# Patient Record
Sex: Female | Born: 1970 | Race: Black or African American | Hispanic: No | Marital: Married | Smoking: Never smoker
Health system: Southern US, Community
[De-identification: ages and names within clinical notes are randomized; demographics above are authoritative.]

## PROBLEM LIST (undated history)

## (undated) DIAGNOSIS — R519 Headache, unspecified: Secondary | ICD-10-CM

## (undated) DIAGNOSIS — L989 Disorder of the skin and subcutaneous tissue, unspecified: Secondary | ICD-10-CM

## (undated) DIAGNOSIS — B019 Varicella without complication: Secondary | ICD-10-CM

## (undated) DIAGNOSIS — D869 Sarcoidosis, unspecified: Secondary | ICD-10-CM

## (undated) DIAGNOSIS — I499 Cardiac arrhythmia, unspecified: Secondary | ICD-10-CM

## (undated) DIAGNOSIS — K219 Gastro-esophageal reflux disease without esophagitis: Secondary | ICD-10-CM

## (undated) DIAGNOSIS — D649 Anemia, unspecified: Secondary | ICD-10-CM

## (undated) DIAGNOSIS — R51 Headache: Secondary | ICD-10-CM

## (undated) HISTORY — DX: Gastro-esophageal reflux disease without esophagitis: K21.9

## (undated) HISTORY — DX: Varicella without complication: B01.9

## (undated) HISTORY — DX: Anemia, unspecified: D64.9

## (undated) HISTORY — PX: COLONOSCOPY: SHX174

## (undated) HISTORY — PX: TUBAL LIGATION: SHX77

---

## 1998-02-19 ENCOUNTER — Ambulatory Visit (HOSPITAL_COMMUNITY): Admission: RE | Admit: 1998-02-19 | Discharge: 1998-02-19 | Payer: Self-pay | Admitting: Obstetrics & Gynecology

## 1998-03-25 ENCOUNTER — Inpatient Hospital Stay (HOSPITAL_COMMUNITY): Admission: AD | Admit: 1998-03-25 | Discharge: 1998-03-25 | Payer: Self-pay | Admitting: Obstetrics & Gynecology

## 1998-06-17 ENCOUNTER — Inpatient Hospital Stay (HOSPITAL_COMMUNITY): Admission: AD | Admit: 1998-06-17 | Discharge: 1998-06-21 | Payer: Self-pay | Admitting: *Deleted

## 1999-12-28 ENCOUNTER — Inpatient Hospital Stay (HOSPITAL_COMMUNITY): Admission: AD | Admit: 1999-12-28 | Discharge: 1999-12-28 | Payer: Self-pay | Admitting: Obstetrics and Gynecology

## 2000-02-22 ENCOUNTER — Inpatient Hospital Stay (HOSPITAL_COMMUNITY): Admission: AD | Admit: 2000-02-22 | Discharge: 2000-02-25 | Payer: Self-pay | Admitting: Obstetrics and Gynecology

## 2000-02-22 ENCOUNTER — Encounter (INDEPENDENT_AMBULATORY_CARE_PROVIDER_SITE_OTHER): Payer: Self-pay

## 2005-01-16 ENCOUNTER — Ambulatory Visit: Payer: Self-pay | Admitting: Pulmonary Disease

## 2005-03-06 ENCOUNTER — Ambulatory Visit: Payer: Self-pay | Admitting: Cardiology

## 2005-03-13 ENCOUNTER — Ambulatory Visit: Payer: Self-pay | Admitting: Pulmonary Disease

## 2007-01-30 ENCOUNTER — Emergency Department (HOSPITAL_COMMUNITY): Admission: EM | Admit: 2007-01-30 | Discharge: 2007-01-31 | Payer: Self-pay | Admitting: Family Medicine

## 2007-08-14 ENCOUNTER — Ambulatory Visit: Payer: Self-pay | Admitting: Pulmonary Disease

## 2007-08-19 ENCOUNTER — Ambulatory Visit: Admission: RE | Admit: 2007-08-19 | Discharge: 2007-08-19 | Payer: Self-pay | Admitting: Pulmonary Disease

## 2007-08-20 DIAGNOSIS — I209 Angina pectoris, unspecified: Secondary | ICD-10-CM

## 2007-08-20 DIAGNOSIS — I10 Essential (primary) hypertension: Secondary | ICD-10-CM | POA: Insufficient documentation

## 2007-08-20 DIAGNOSIS — R05 Cough: Secondary | ICD-10-CM

## 2007-08-20 DIAGNOSIS — I499 Cardiac arrhythmia, unspecified: Secondary | ICD-10-CM | POA: Insufficient documentation

## 2007-08-21 ENCOUNTER — Telehealth (INDEPENDENT_AMBULATORY_CARE_PROVIDER_SITE_OTHER): Payer: Self-pay | Admitting: *Deleted

## 2008-05-20 ENCOUNTER — Encounter: Admission: RE | Admit: 2008-05-20 | Discharge: 2008-05-20 | Payer: Self-pay | Admitting: Internal Medicine

## 2009-06-21 ENCOUNTER — Encounter: Admission: RE | Admit: 2009-06-21 | Discharge: 2009-06-21 | Payer: Self-pay | Admitting: Internal Medicine

## 2009-09-30 ENCOUNTER — Encounter: Admission: RE | Admit: 2009-09-30 | Discharge: 2009-09-30 | Payer: Self-pay | Admitting: Internal Medicine

## 2009-10-06 ENCOUNTER — Encounter: Admission: RE | Admit: 2009-10-06 | Discharge: 2009-10-06 | Payer: Self-pay | Admitting: Obstetrics and Gynecology
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
## 2011-02-10 NOTE — Discharge Summary (Signed)
[facility]  Patient:    Cheyenne Walters, Cheyenne Walters                          MRN: [id number] Adm. Date:  [id number] Disc. Date: [id number] Attending:  Leonard Schwartz Dictator:   Vance Gather Duplantis, C.N.M.                           Discharge Summary  ADMISSION DIAGNOSES:          1. Intrauterine pregnancy at term.                               2. History of previous cesarean section.                               3. Multiparity.                               4. Desires sterilization.                               5. History of uterine fibroids.  DISCHARGE DIAGNOSES:          1. Intrauterine pregnancy at term.                               2. History of previous cesarean section.                               3. Multiparity.                               4. Desires sterilization.                               5. History of uterine fibroids.                               6. Bottlefeeding.                               7. Status post cesarean section and bilateral tubal                                  ligation.  PROCEDURE:                    Repeat low transverse cesarean section with bilateral tubal ligation on Feb 22, 2000, for delivery of a viable female infant who weighed 6 pounds 14 ounces and had Apgars of 9 and 9 by Janine Limbo, M.D. on Feb 22, 2000.  HOSPITAL COURSE:              Cheyenne Walters is a 40 year old, gravida 2, para 1-0-0-1, at term who was admitted for repeat cesarean section and who delivered a viable  female infant who  weighed 6 pounds 14 ounces and had Apgars of 9 and 9, by low transverse cesarean section on Feb 22, 2000.  She also desired a bilateral tubal ligation and underwent the same during the time of her cesarean section. Postoperatively, she has done well.  She is ambulating, voiding, and eating without difficulty.  Her vital signs are stable and she is afebrile.  She is deemed ready for discharge today.  DISCHARGE  INSTRUCTIONS:       As per the [facility] and Gynecology handout.  DISCHARGE MEDICATIONS:        1. Motrin 600 mg p.o. q.6h. p.r.n. for pain.                               2. Tylox one to two p.o. q.4-6h. p.r.n. for pain.                               3. Prenatal vitamins daily.  FOLLOW-UP:                    In six weeks at [facility] and Gynecology.  DISCHARGE LABORATORY DATA:    Her hemoglobin is 11.1, WBC count is 8.0, and her  platelets are 150. DD:  02/25/00 TD:  02/28/00 Job: [id number]

## 2011-02-10 NOTE — Op Note (Signed)
[facility]  Patient:    Cheyenne Walters, Cheyenne Walters                          MRN: [id number] Proc. Date: 02/22/00 Adm. Date:  [id number] Attending:  Leonard Schwartz                           Operative Report  PREOPERATIVE DIAGNOSIS:       Term intrauterine pregnancy.  Prior cesarean section. Desires sterilization.  POSTOPERATIVE DIAGNOSIS:      Term intrauterine pregnancy.  Prior cesarean section. Desires sterilization.  Fibroid uterus.  OPERATION:                    Repeat low transverse cesarean section and bilateral tubal ligation.  SURGEON:                      Janine Limbo, M.D.  ASSISTANT:                    Wynelle Bourgeois, C.N.M.  ANESTHESIA:                   Spinal.  ESTIMATED BLOOD LOSS:  INDICATIONS:                  Cheyenne Walters is a 40 year old female, gravida 2, para  1-0-0-1, who presents at [redacted] weeks gestation for repeat cesarean delivery.  She understands the indications for her procedure and she accepts the risks of, but not limited to, anesthetic complications, bleeding, infections, and possible damage to the surrounding organs.  The patient desires sterilization and she understands hat there is a small, but real failure rate associated with this procedure (10 per 1000).  FINDINGS:                     A 6 pound 14 ounce female infant (name unknown) was  delivered from a cephalic presentation.  The Apgars were 9 at one minute and 9 t five minutes.  The fallopian tubes and ovaries were normal.  There was a 1 cm fibroid present on the posterior fundal portion of the uterus.  There was a nuchal cord present.  The placenta and umbilical cord appeared normal.  DESCRIPTION OF PROCEDURE:     The patient was taken to the operating room where a spinal anesthetic was given.  The patients abdomen was prepped with multiple layers of Betadine as was the perineum.  A Foley catheter was placed in the bladder.  The patient was sterilely  draped.  A low transverse incision was made in the abdomen and carried sharply through the subcutaneous tissue, the fascia, and the anterior peritoneum.  An incision was made in the lower uterine segment and  extended transversely.  The fetal head was delivered without difficulty.  The mouth and nose were suctioned.  The remainder of the infant was delivered.  The infant was handed to the awaiting pediatric team.  Routine cord blood studies were obtained. The placenta was removed.  The uterine cavity was cleaned of amniotic  fluid and clotted blood.  The uterine incision was closed using a running locking suture of 2-0 Vicryl.  Hemostasis was adequate.  The left fallopian tube was identified and followed to its fimbriated end.  A knuckle of tube was made on the left using a free tie and then a  tied suture ligature of 0 plain catgut.  The knuckle of tube thus made was sharply excised.  The cut ends of the tube were cauterized using the Bovie cautery.  Hemostasis was adequate.  An identical procedure was carried out on the opposite side.  Again hemostasis was adequate.  The pericolonic gutters were cleaned of amniotic fluid and clotted blood.  The anterior peritoneum and the abdominal musculature were reapproximated in the midline using interrupted sutures of 2-0 Vicryl.  The fascia and the abdominal musculature were irrigated.  Hemostasis was adequate.  The fascia was closed using a running suture of 0 Vicryl followed by three interrupted sutures of 0 Vicryl.  Again hemostasis was adequate.  The subcutaneous tissue was closed using a running suture of 2-0 Vicryl.  The skin was reapproximated using skin staples.  Sponge,  needle, and instrument counts were correct x 2 occasions.  The estimated blood oss for the procedure was 600 cc.  The patient tolerated her procedure well.  She was awakened from her anesthetic and taken to the recovery room in stable condition. She was noted  to drain clear, yellow urine at the end of our procedure.  The infant was taken to the fullterm nursery in stable condition. DD:  02/22/00 TD:  02/22/00 Job: [id number]

## 2011-02-10 NOTE — H&P (Signed)
[facility]  Patient:    Cheyenne Walters, Cheyenne Walters                          MRN: [id number] Adm. Date:  [id number] Disc. Date: [id number] Attending:  Shaune Spittle                         History and Physical  HISTORY OF PRESENT ILLNESS:   Ms. Huge is a 40 year old female, gravida 2, para  1-0-0-1, who presents at 104 weeks gestation [facility] March 05, 2000) for repeat cesarean section and tubal ligation.  The patient has been followed at [facility] and Gynecology for this pregnancy that has been complicated by a positive Beta Strep culture.  She also is known to be rh negative.  She has had a prior low transverse cesarean section and she desires repeat cesarean delivery.  She has a known history of fibroids.  She has done quite well during this pregnancy.  PAST OBSTETRIC HISTORY:       In September of 1999, the patient had a cesarean section for failure to progress past 6 cm.  During that time, she delivered a 7  pound 7 ounce female infant (Tavis).  ALLERGIES:                    No known drug allergies.  PAST MEDICAL HISTORY:         The patient had her wisdom teeth removed in 1989.   SOCIAL HISTORY:               The patient is married and she works as an Product/process development scientist. She denies cigarette use, alcohol use, and recreational drug use.  REVIEW OF SYSTEMS:            Normal pregnancy complaints.  FAMILY HISTORY:               The patient has a family history of chronic hypertension, diabetes, seizure disorders, and prostate cancer.  PHYSICAL EXAMINATION:  VITAL SIGNS:                  Weight 191 pounds.  HEENT:                        Within normal limits.  CHEST:                        Clear.  HEART:                        Regular rate and rhythm.  BREASTS:                      Without masses.  ABDOMEN:                      Gravid with a fundal height of 37 cm.  NEUROLOGICAL:                 Normal.  EXTREMITIES:                   Within normal limits.  PELVIC:                       Cervix is closed and long.  LABORATORY DATA:  Blood type is B negative, antibody screen negative, sickle cell negative, VDRL nonreactive, rubella positive.  HBSAG negative.  GC negative.  Chlamydia negative.  Glucola 100.  Beta Strep was positive during her urine culture in January of 2001.  ASSESSMENT:                   1. 38-week gestation.                               2. Prior cesarean delivery.                               3. Desires sterilization.                               4. Positive Beta Strep in urine culture.  PLAN:                         The patient will undergo a repeat low transverse cesarean section and bilateral tubal ligation.  She understands the indications for her procedure and she accepts the risks of, but not limited to, anesthetic complications, bleeding, infections, and possible damage to the surrounding organs. DD:  02/21/00 TD:  02/21/00 Job: [id number]

## 2016-04-21 ENCOUNTER — Other Ambulatory Visit: Payer: Self-pay | Admitting: Internal Medicine

## 2016-04-21 DIAGNOSIS — Z1231 Encounter for screening mammogram for malignant neoplasm of breast: Secondary | ICD-10-CM

## 2016-06-09 ENCOUNTER — Ambulatory Visit
Admission: RE | Admit: 2016-06-09 | Discharge: 2016-06-09 | Disposition: A | Discharge status: Home / Self Care | Payer: BLUE CROSS/BLUE SHIELD | Source: Ambulatory Visit | Attending: Internal Medicine | Admitting: Internal Medicine

## 2016-06-09 DIAGNOSIS — Z1231 Encounter for screening mammogram for malignant neoplasm of breast: Secondary | ICD-10-CM

## 2016-09-05 ENCOUNTER — Ambulatory Visit (INDEPENDENT_AMBULATORY_CARE_PROVIDER_SITE_OTHER): Payer: BLUE CROSS/BLUE SHIELD | Admitting: Family Medicine

## 2016-09-05 ENCOUNTER — Encounter: Payer: Self-pay | Admitting: Family Medicine

## 2016-09-05 VITALS — BP 120/78 | HR 83 | Resp 12 | Ht 61.0 in | Wt 167.5 lb

## 2016-09-05 DIAGNOSIS — E785 Hyperlipidemia, unspecified: Secondary | ICD-10-CM

## 2016-09-05 DIAGNOSIS — D509 Iron deficiency anemia, unspecified: Secondary | ICD-10-CM

## 2016-09-05 DIAGNOSIS — K219 Gastro-esophageal reflux disease without esophagitis: Secondary | ICD-10-CM | POA: Diagnosis not present

## 2016-09-05 DIAGNOSIS — R0600 Dyspnea, unspecified: Secondary | ICD-10-CM | POA: Diagnosis not present

## 2016-09-05 DIAGNOSIS — Z6831 Body mass index (BMI) 31.0-31.9, adult: Secondary | ICD-10-CM

## 2016-09-05 DIAGNOSIS — E669 Obesity, unspecified: Secondary | ICD-10-CM | POA: Insufficient documentation

## 2016-09-05 LAB — CBC
HEMATOCRIT: 36.2 % (ref 36.0–46.0)
HEMOGLOBIN: 12 g/dL (ref 12.0–15.0)
MCHC: 33.2 g/dL (ref 30.0–36.0)
MCV: 87.2 fl (ref 78.0–100.0)
PLATELETS: 234 10*3/uL (ref 150.0–400.0)
RBC: 4.16 Mil/uL (ref 3.87–5.11)
RDW: 14.3 % (ref 11.5–15.5)
WBC: 3.1 10*3/uL — AB (ref 4.0–10.5)

## 2016-09-05 LAB — BASIC METABOLIC PANEL
BUN: 11 mg/dL (ref 6–23)
CO2: 28 mEq/L (ref 19–32)
CREATININE: 0.54 mg/dL (ref 0.40–1.20)
Calcium: 9.2 mg/dL (ref 8.4–10.5)
Chloride: 104 mEq/L (ref 96–112)
GFR: 155.35 mL/min (ref >60.00)
GLUCOSE: 96 mg/dL (ref 70–99)
Potassium: 4.2 mEq/L (ref 3.5–5.1)
Sodium: 138 mEq/L (ref 135–145)

## 2016-09-05 LAB — FERRITIN: Ferritin: 21.5 ng/mL (ref 10.0–291.0)

## 2016-09-05 LAB — LIPID PANEL
CHOL/HDL RATIO: 4
CHOLESTEROL: 227 mg/dL — AB (ref 0–200)
HDL: 53.8 mg/dL (ref >39.00)
LDL CALC: 149 mg/dL — AB (ref 0–99)
NonHDL: 172.81
TRIGLYCERIDES: 117 mg/dL (ref 0.0–149.0)
VLDL: 23.4 mg/dL (ref 0.0–40.0)

## 2016-09-05 LAB — VITAMIN B12: Vitamin B-12: 324 pg/mL (ref 211–911)

## 2016-09-05 MED ORDER — OMEPRAZOLE 20 MG PO CPDR: 20.0000 mg | DELAYED_RELEASE_CAPSULE | Freq: Every day | ORAL | 1 refills | Status: DC

## 2016-09-05 MED ORDER — FERROUS SULFATE 325 (65 FE) MG PO TABS: 325.0000 mg | ORAL_TABLET | Freq: Two times a day (BID) | ORAL | 2 refills | Status: DC

## 2016-09-05 NOTE — Progress Notes (Signed)
Pre visit review using our clinic review tool, if applicable. No additional management support is needed unless otherwise documented below in the visit note. 

## 2016-09-05 NOTE — Patient Instructions (Addendum)
A few things to remember from today's visit:   Gastroesophageal reflux disease, esophagitis presence not specified  Iron deficiency anemia, unspecified iron deficiency anemia type - Plan: CBC, Ferritin, Vitamin B12  Hyperlipidemia, unspecified hyperlipidemia type - Plan: Lipid panel, Basic metabolic panel  BMI [id number]   What are some tips for weight loss? People become overweight for many reasons. Weight issues can run in families. They can be caused by unhealthy behaviors and a person's environment. Certain health problems and medicines can also lead to weight gain. There are some simple things you can do to reach and maintain a healthy weight:  Eat small more frequent healthy meals instead 3 bid meals. Also Weight Watchers is a good option. Avoid sweet drinks. These include regular soft drinks, fruit juices, fruit drinks, energy drinks, sweetened iced tea, and flavored milk. Avoid fast foods. Fast foods such as french fries, hamburgers, chicken nuggets, and pizza are high in calories and can cause weight gain. Eat a healthy breakfast. People who skip breakfast tend to weigh more. Don't watch more than two hours of television per day. Chew sugar-free gum between meals to cut down on snacking. Avoid grocery shopping when you're hungry. Pack a healthy lunch instead of eating out to control what and how much you eat. Eat a lot of fruits and vegetables. Aim for about 2 cups of fruit and 2 to 3 cups of vegetables per day. Aim for 150 minutes per week of moderate-intensity exercise (such as brisk walking), or 75 minutes per week of vigorous exercise (such as jogging or running). OR 15-30 min of daily brisk walking. Be more active. Small changes in physical activity can easily be added to your daily routine. For example, take the stairs instead of the elevator. Take a walk with your family. A daily walk is a great way to get exercise and to catch up on the day's events.    Avoid foods  that make your symptoms worse, for example coffee, chocolate,pepermeint,alcohol, and greasy food. Raising the head of your bed about 6 inches may help with nocturnal symptoms.  Avoid tobacco use. Weight loss (if you are overweight). Avoid lying down for 3 hours after eating.  Instead 3 large meals daily try small and more frequent meals during the day.    You should be evaluated immediately if bloody vomiting, bloody stools, black stools (like tar), difficulty swallowing, food gets stuck on the way down or choking when eating. Abnormal weight loss or severe abdominal pain.  If symptoms are not resolved sometimes endoscopy is necessary.  Please be sure medication list is accurate. If a new problem present, please set up appointment sooner than planned today.
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
## 2016-09-11 ENCOUNTER — Telehealth: Payer: Self-pay | Admitting: Family Medicine

## 2016-09-11 NOTE — Telephone Encounter (Signed)
° ° ° °  Would like a call back with lab results

## 2016-09-11 NOTE — Telephone Encounter (Signed)
Do you have her lab results?

## 2016-09-11 NOTE — Telephone Encounter (Signed)
Informed patient of results and patient verbalized understanding.  

## 2016-09-11 NOTE — Telephone Encounter (Signed)
I sent results to your inbox. Thanks, BJ

## 2016-10-02 ENCOUNTER — Ambulatory Visit (INDEPENDENT_AMBULATORY_CARE_PROVIDER_SITE_OTHER): Payer: BLUE CROSS/BLUE SHIELD | Admitting: Family Medicine

## 2016-10-02 ENCOUNTER — Encounter: Payer: Self-pay | Admitting: Family Medicine

## 2016-10-02 VITALS — BP 118/78 | HR 98 | Temp 98.1°F | Ht 61.0 in | Wt 164.9 lb

## 2016-10-02 DIAGNOSIS — J069 Acute upper respiratory infection, unspecified: Secondary | ICD-10-CM

## 2016-10-02 DIAGNOSIS — B9789 Other viral agents as the cause of diseases classified elsewhere: Secondary | ICD-10-CM | POA: Diagnosis not present

## 2016-10-02 DIAGNOSIS — L0292 Furuncle, unspecified: Secondary | ICD-10-CM | POA: Diagnosis not present

## 2016-10-02 MED ORDER — DOXYCYCLINE HYCLATE 100 MG PO CAPS: 100.0000 mg | ORAL_CAPSULE | Freq: Two times a day (BID) | ORAL | 0 refills | Status: DC

## 2016-10-02 NOTE — Progress Notes (Signed)
Pre visit review using our clinic review tool, if applicable. No additional management support is needed unless otherwise documented below in the visit note. 

## 2016-10-02 NOTE — Patient Instructions (Addendum)
Please take the antibiotic as instructed.  Please use warm compresses several times per day.  See care immediately if area on leg is enlarging, spreading or you have fevers or are feeling worse.   INSTRUCTIONS FOR UPPER RESPIRATORY INFECTION:  -plenty of rest and fluids  -nasal saline wash 2-3 times daily (use prepackaged nasal saline or bottled/distilled water if making your own)   -can use AFRIN nasal spray for drainage and nasal congestion - but do NOT use longer then 3-4 days  -can use tylenol (in no history of liver disease) or ibuprofen (if no history of kidney disease, bowel bleeding or significant heart disease) as directed for aches and sorethroat  -in the winter time, using a humidifier at night is helpful (please follow cleaning instructions)  -if you are taking a cough medication - use only as directed, may also try a teaspoon of honey to coat the throat and throat lozenges.  -for sore throat, salt water gargles can help  -follow up if you have fevers, facial pain, tooth pain, difficulty breathing or are worsening or symptoms persist longer then expected  Upper Respiratory Infection, Adult An upper respiratory infection (URI) is also known as the common cold. It is often caused by a type of germ (virus). Colds are easily spread (contagious). You can pass it to others by kissing, coughing, sneezing, or drinking out of the same glass. Usually, you get better in 1 to 3  weeks.  However, the cough can last for even longer. HOME CARE   Only take medicine as told by your doctor. Follow instructions provided above.  Drink enough water and fluids to keep your pee (urine) clear or pale yellow.  Get plenty of rest.  Return to work when your temperature is < 100 for 24 hours or as told by your doctor. You may use a face mask and wash your hands to stop your cold from spreading. GET HELP RIGHT AWAY IF:   After the first few days, you feel you are getting worse.  You have  questions about your medicine.  You have chills, shortness of breath, or red spit (mucus).  You have pain in the face for more then 1-2 days, especially when you bend forward.  You have a fever, puffy (swollen) neck, pain when you swallow, or white spots in the back of your throat.  You have a bad headache, ear pain, sinus pain, or chest pain.  You have a high-pitched whistling sound when you breathe in and out (wheezing).  You cough up blood.  You have sore muscles or a stiff neck. MAKE SURE YOU:   Understand these instructions.  Will watch your condition.  Will get help right away if you are not doing well or get worse. Document Released: 02/28/2008 Document Revised: 12/04/2011 Document Reviewed: 12/17/2013 [facility] Patient Information 2015 [place], [place]. This information is not intended to replace advice given to you by your health care provider. Make sure you discuss any questions you have with your health care provider.

## 2016-10-02 NOTE — Progress Notes (Signed)
HPI:  Cheyenne Walters is a pleasant 46 yo here for an acute visit for 2 issues:  1) URI: -started about 3 days ago -symptoms: sore throat, nasal congestion, PND, mild cough -no fevers, body aches, NVD, SOB -has not tried anything for this  2) Boil: -L inner thigh - small lump, some tenderness -for about 4 days -reports hx same that she has popped on her own or seen [facility] for -she also sees a dermatologist -no fevers, malaise, drainage   ROS: See pertinent positives and negatives per HPI.  Past Medical History:  Diagnosis Date  . Anemia   . Chicken pox   . GERD (gastroesophageal reflux disease)     No past surgical history on file.  Family History  Problem Relation Age of Onset  . Alcohol abuse Father   . Hypertension Maternal Grandmother   . Hypertension Maternal Grandfather   . Cancer Neg Hx     colon or gyn    Social History   Social History  . Marital status: Married    Spouse name: N/A  . Number of children: N/A  . Years of education: N/A   Social History Main Topics  . Smoking status: Never Smoker  . Smokeless tobacco: Never Used  . Alcohol use Yes  . Drug use: No  . Sexual activity: Not Asked   Other Topics Concern  . None   Social History Narrative  . None     Current Outpatient Prescriptions:  .  ferrous sulfate 325 (65 FE) MG tablet, Take 1 tablet (325 mg total) by mouth 2 (two) times daily with a meal., Disp: 180 tablet, Rfl: 2 .  Multiple Vitamins-Minerals (MULTIPLE VITAMINS/WOMENS PO), Take 1 tablet by mouth daily., Disp: , Rfl:  .  omeprazole (PRILOSEC) 20 MG capsule, Take 1 capsule (20 mg total) by mouth daily before breakfast., Disp: 90 capsule, Rfl: 1 .  doxycycline (VIBRAMYCIN) 100 MG capsule, Take 1 capsule (100 mg total) by mouth 2 (two) times daily., Disp: 20 capsule, Rfl: 0  EXAM:  Vitals:   10/02/16 0935  BP: 118/78  Pulse: 98  Temp: 98.1 F (36.7 C)    Body mass index is 31.16 kg/m.  GENERAL: vitals reviewed and  listed above, alert, oriented, appears well hydrated and in no acute distress  HEENT: atraumatic, conjunttiva clear, no obvious abnormalities on inspection of external nose and ears, normal appearance of ear canals and TMs, clear nasal congestion, mild post oropharyngeal erythema with PND, no tonsillar edema or exudate, no sinus TTP  NECK: no obvious masses on inspection  LUNGS: clear to auscultation bilaterally, no wheezes, rales or rhonchi, good air movement  CV: HRRR, no peripheral edema  SKIN: small area erythema/induration L inner thigh, no appreciable fluctuance  MS: moves all extremities without noticeable abnormality  PSYCH: pleasant and cooperative, no obvious depression or anxiety  ASSESSMENT AND PLAN:  Discussed the following assessment and plan:  Viral URI -likely viral, symptomatic care dicussed, return precautions  Boil -no appreciable abscess - discussed options including I and D for exploration potential small abscess vs compresses, vs abx + compresses, etc -she opted for doxy (after discussion risks) and compresses as may have mild cellulitis -return precautions  -Patient advised to return or notify a doctor immediately if symptoms worsen or persist or new concerns arise.  Patient Instructions  Please take the antibiotic as instructed.  Please use warm compresses several times per day.  See care immediately if area on leg is enlarging, spreading or  you have fevers or are feeling worse.   INSTRUCTIONS FOR UPPER RESPIRATORY INFECTION:  -plenty of rest and fluids  -nasal saline wash 2-3 times daily (use prepackaged nasal saline or bottled/distilled water if making your own)   -can use AFRIN nasal spray for drainage and nasal congestion - but do NOT use longer then 3-4 days  -can use tylenol (in no history of liver disease) or ibuprofen (if no history of kidney disease, bowel bleeding or significant heart disease) as directed for aches and sorethroat  -in  the winter time, using a humidifier at night is helpful (please follow cleaning instructions)  -if you are taking a cough medication - use only as directed, may also try a teaspoon of honey to coat the throat and throat lozenges.  -for sore throat, salt water gargles can help  -follow up if you have fevers, facial pain, tooth pain, difficulty breathing or are worsening or symptoms persist longer then expected  Upper Respiratory Infection, Adult An upper respiratory infection (URI) is also known as the common cold. It is often caused by a type of germ (virus). Colds are easily spread (contagious). You can pass it to others by kissing, coughing, sneezing, or drinking out of the same glass. Usually, you get better in 1 to 3  weeks.  However, the cough can last for even longer. HOME CARE   Only take medicine as told by your doctor. Follow instructions provided above.  Drink enough water and fluids to keep your pee (urine) clear or pale yellow.  Get plenty of rest.  Return to work when your temperature is < 100 for 24 hours or as told by your doctor. You may use a face mask and wash your hands to stop your cold from spreading. GET HELP RIGHT AWAY IF:   After the first few days, you feel you are getting worse.  You have questions about your medicine.  You have chills, shortness of breath, or red spit (mucus).  You have pain in the face for more then 1-2 days, especially when you bend forward.  You have a fever, puffy (swollen) neck, pain when you swallow, or white spots in the back of your throat.  You have a bad headache, ear pain, sinus pain, or chest pain.  You have a high-pitched whistling sound when you breathe in and out (wheezing).  You cough up blood.  You have sore muscles or a stiff neck. MAKE SURE YOU:   Understand these instructions.  Will watch your condition.  Will get help right away if you are not doing well or get worse. Document Released: 02/28/2008 Document  Revised: 12/04/2011 Document Reviewed: 12/17/2013 [facility] Patient Information 2015 [place], [place]. This information is not intended to replace advice given to you by your health care provider. Make sure you discuss any questions you have with your health care provider.        Colin Benton R., DO

## 2017-03-05 NOTE — Progress Notes (Signed)
HPI:   [name] is a 46 y.o. female, who is here today to follow on some chronic medical problems.  She was last seen on 09/05/17, since then she has had an acute visit for URI symptoms.   GERD: Last OV she was recommended taking PPI daily, Omeprazole 20 mg. Symptoms improved,so now she is taking it prn. She is avoiding foods that aggravate symptoms.  Denies abdominal pain, nausea, vomiting, changes in bowel habits, blood in stool or melena.  Dyspnea: Last OV she was also c/o exertional and associated with cough. Reporting resolution of symptoms. She has been exercising regularly and gaining endurance. She denies chest pain or wheezing.  She has Hx of sarcoid.   Obesity:  Dietary changes since her last OV : She has not followed a healthier diet. Rating cereal , chips for lunch, dinner break is at 8 pm while she is still working and she is not very hungry then.So when she gets home around 12 midnight, she is starving. She is working second shift: 3 pm -11:30 pm  Exercising: Walking 3 times per week.   Wt Readings from Last 3 Encounters:  03/06/17 168 lb 6 oz (76.4 kg)  10/02/16 164 lb 14.4 oz (74.8 kg)  09/05/16 167 lb 8 oz (76 kg)    Concerns today:   For 1-2 weeks she has had left groin pain that radiates back and fourth to lower back. No clear Hx of injury but she noticed after she started frequent squatting/bending down to go under a type of thin bar , which is part of her job descriptions. She does not recall feeling pain upon doing so. Pain is a constant mild aching and intermittent sharp more intense pain,5/10, exacerbated by bending down and standing, also when sleeping on left side. Alleviated by sitting.  She is having daily bowel movements, occasional constipation.  Denies abdominal pain, nausea, vomiting,or blood in stool..  Denies dysuria,increased urinary frequency, gross hematuria,or decreased urine output.  LMP 02/08/17, she noted  noted vaginal spotting Monday last week.   Review of Systems  Constitutional: Negative for activity change, appetite change, fatigue and fever.  HENT: Negative for mouth sores, nosebleeds and trouble swallowing.   Respiratory: Negative for cough, shortness of breath and wheezing.   Cardiovascular: Negative for chest pain, palpitations and leg swelling.  Gastrointestinal: Negative for blood in stool, nausea and vomiting.       Negative for changes in bowel habits.  Genitourinary: Negative for decreased urine volume, difficulty urinating, dysuria, hematuria, pelvic pain and vaginal discharge.  Musculoskeletal: Positive for back pain. Negative for gait problem and joint swelling.  Skin: Negative for color change and rash.  Neurological: Negative for syncope, weakness, numbness and headaches.  Psychiatric/Behavioral: Positive for sleep disturbance. Negative for confusion.      Current Outpatient Prescriptions on File Prior to Visit  Medication Sig Dispense Refill  . ferrous sulfate 325 (65 FE) MG tablet Take 1 tablet (325 mg total) by mouth 2 (two) times daily with a meal. 180 tablet 2  . Multiple Vitamins-Minerals (MULTIPLE VITAMINS/WOMENS PO) Take 1 tablet by mouth daily.    Marland Kitchen omeprazole (PRILOSEC) 20 MG capsule Take 1 capsule (20 mg total) by mouth daily before breakfast. 90 capsule 1   No current facility-administered medications on file prior to visit.      Past Medical History:  Diagnosis Date  . Anemia   . Chicken pox   . GERD (gastroesophageal reflux disease)  Allergies  Allergen Reactions  . Hydrocodone-Acetaminophen     Social History   Social History  . Marital status: Married    Spouse name: N/A  . Number of children: N/A  . Years of education: N/A   Social History Main Topics  . Smoking status: Never Smoker  . Smokeless tobacco: Never Used  . Alcohol use Yes  . Drug use: No  . Sexual activity: Not Asked   Other Topics Concern  . None   Social  History Narrative  . None    Vitals:   03/06/17 0944  BP: 118/76  Pulse: 92  Resp: 12  O2 sat at RA 97% Body mass index is 31.81 kg/m.   Physical Exam  Nursing note and vitals reviewed. Constitutional: She is oriented to person, place, and time. She appears well-developed. She does not appear ill. No distress.  HENT:  Head: Atraumatic.  Mouth/Throat: Oropharynx is clear and moist and mucous membranes are normal.  Eyes: Conjunctivae and EOM are normal.  Cardiovascular: Normal rate and regular rhythm.   No murmur heard. Respiratory: Effort normal and breath sounds normal. No respiratory distress.  GI: Soft. Bowel sounds are normal. She exhibits mass. She exhibits no distension. There is no hepatomegaly. There is tenderness in the periumbilical area, suprapubic area and left upper quadrant. There is no rebound and no guarding. A hernia is present.    Umbilical hernia , tender when trying to reduce, solid content.  Musculoskeletal: She exhibits no edema.       Lumbar back: She exhibits tenderness. She exhibits no bony tenderness.  Pain upon palpation of left lumbar paraspinal muscles. Hip ROM normal and no pain elicited with flexion and extension. Mild pain at full external rotation.  Lymphadenopathy:    She has no cervical adenopathy.       Right: No supraclavicular adenopathy present.       Left: No supraclavicular adenopathy present.  Neurological: She is alert and oriented to person, place, and time. She has normal strength. Gait normal.  SLR negative, bilateral.  Skin: Skin is warm. No rash noted. No erythema.  Psychiatric: She has a normal mood and affect.  Well groomed, good eye contact.    ASSESSMENT AND PLAN:   Ms. Cheyenne Walters was seen today for follow-up.  Diagnoses and all orders for this visit:  Left lower quadrant pain  We discussed possible etiologies: GI, gyn, urologic,and MS. ? Radicular pain. Instructed about warning signs. If needed she can take OTC  NSAID's with food due to x of GERD.  -     US Pelvis Complete; Future -     US Transvaginal Non-OB; Future  Abdominal mass, unspecified abdominal location  I have noted abnormality during last OV, she has reported Hx of fibroid. Today it seem more noticeable (at least as I can recall) and seem to be protruding through umbilical hernia ,also tender. Instructed about warning signs.  -     US Pelvis Complete; Future -     US Transvaginal Non-OB; Future  Gastroesophageal reflux disease, esophagitis presence not specified  Improved. Continue GERD precautions, needs to work on late food intake. Continue Omeprazole 20 mg daily as needed.  BMI 31.0-31.9,adult  We discussed benefits of wt loss as well as adverse effects of obesity. Consistency with healthy diet and physical activity recommended. Recommend eating at 8 pm when she has her break at work,so hopefully she can stop eating around midnight.  Continue walking for 15-30 min, increase frequency as tolerated.  Acute left-sided low back pain, with sciatica presence unspecified  She is not interested in Rx medication for back. Recommend avoiding activities that aggravate pain. She does not want a note for duty restrictions at work. Instructed about warning signs and f/u as needed.   -Ms. Cheyenne Walters was advised to return sooner than planned today if new concerns arise.       Alyaan Budzynski G. [place], MD  [facility]. [place] office.

## 2017-03-06 ENCOUNTER — Ambulatory Visit (INDEPENDENT_AMBULATORY_CARE_PROVIDER_SITE_OTHER): Payer: BLUE CROSS/BLUE SHIELD | Admitting: Family Medicine

## 2017-03-06 ENCOUNTER — Ambulatory Visit: Payer: BLUE CROSS/BLUE SHIELD | Admitting: Family Medicine

## 2017-03-06 ENCOUNTER — Encounter: Payer: Self-pay | Admitting: Family Medicine

## 2017-03-06 VITALS — BP 118/76 | HR 92 | Resp 12 | Ht 61.0 in | Wt 168.4 lb

## 2017-03-06 DIAGNOSIS — Z6831 Body mass index (BMI) 31.0-31.9, adult: Secondary | ICD-10-CM

## 2017-03-06 DIAGNOSIS — R1032 Left lower quadrant pain: Secondary | ICD-10-CM | POA: Diagnosis not present

## 2017-03-06 DIAGNOSIS — R1900 Intra-abdominal and pelvic swelling, mass and lump, unspecified site: Secondary | ICD-10-CM

## 2017-03-06 DIAGNOSIS — M545 Low back pain: Secondary | ICD-10-CM

## 2017-03-06 DIAGNOSIS — K219 Gastro-esophageal reflux disease without esophagitis: Secondary | ICD-10-CM | POA: Diagnosis not present

## 2017-03-06 NOTE — Patient Instructions (Signed)
A few things to remember from today's visit:   Gastroesophageal reflux disease, esophagitis presence not specified  BMI 31.0-31.9,adult  Left lower quadrant pain - Plan: US Pelvis Complete, US Transvaginal Non-OB  Abdominal mass, unspecified abdominal location - Plan: US Pelvis Complete, US Transvaginal Non-OB   Please be sure medication list is accurate. If a new problem present, please set up appointment sooner than planned today.

## 2017-03-13 ENCOUNTER — Ambulatory Visit
Admission: RE | Admit: 2017-03-13 | Discharge: 2017-03-13 | Disposition: A | Discharge status: Home / Self Care | Payer: BLUE CROSS/BLUE SHIELD | Source: Ambulatory Visit | Attending: Family Medicine | Admitting: Family Medicine

## 2017-03-13 DIAGNOSIS — R1032 Left lower quadrant pain: Secondary | ICD-10-CM

## 2017-03-13 DIAGNOSIS — R1900 Intra-abdominal and pelvic swelling, mass and lump, unspecified site: Secondary | ICD-10-CM

## 2017-03-16 ENCOUNTER — Other Ambulatory Visit: Payer: Self-pay

## 2017-03-16 DIAGNOSIS — R1900 Intra-abdominal and pelvic swelling, mass and lump, unspecified site: Secondary | ICD-10-CM

## 2017-03-29 ENCOUNTER — Ambulatory Visit (INDEPENDENT_AMBULATORY_CARE_PROVIDER_SITE_OTHER): Payer: BLUE CROSS/BLUE SHIELD | Admitting: Obstetrics & Gynecology

## 2017-03-29 ENCOUNTER — Encounter: Payer: Self-pay | Admitting: Obstetrics & Gynecology

## 2017-03-29 VITALS — BP 130/82 | Ht 60.5 in | Wt 169.0 lb

## 2017-03-29 DIAGNOSIS — R102 Pelvic and perineal pain: Secondary | ICD-10-CM | POA: Diagnosis not present

## 2017-03-29 DIAGNOSIS — N920 Excessive and frequent menstruation with regular cycle: Secondary | ICD-10-CM

## 2017-03-29 DIAGNOSIS — D251 Intramural leiomyoma of uterus: Secondary | ICD-10-CM | POA: Diagnosis not present

## 2017-03-29 DIAGNOSIS — R938 Abnormal findings on diagnostic imaging of other specified body structures: Secondary | ICD-10-CM

## 2017-03-29 DIAGNOSIS — D252 Subserosal leiomyoma of uterus: Secondary | ICD-10-CM

## 2017-03-29 DIAGNOSIS — D250 Submucous leiomyoma of uterus: Secondary | ICD-10-CM | POA: Diagnosis not present

## 2017-03-29 DIAGNOSIS — R9389 Abnormal findings on diagnostic imaging of other specified body structures: Secondary | ICD-10-CM

## 2017-03-29 MED ORDER — NORETHINDRONE 0.35 MG PO TABS: 1.0000 | ORAL_TABLET | Freq: Every day | ORAL | 2 refills | Status: DC

## 2017-03-29 NOTE — Progress Notes (Signed)
Cheyenne Walters 1970-12-06 [id number]   History:    46 y.o.  G2P2 married.  S/P BT/S.  RP:  Menorrhagia and abdo-pelvic pain with Uterine Fibroids  HPI:  Progessivly worsening menstrual periods with very heavy flow, having to change pads every 15 minutes.  Severe menstrual cramps as well.  Also feels more and more abdo-pelvic discomfort and pain at any time of the cycle.  Increased size of her belly, "I look pregnant!".  On FeSO4 for low iron.  Mictions/BMs wnl.  Breasts wnl.   Past medical history,surgical history, family history and social history were all reviewed and documented in the EPIC chart.  Gynecologic History Patient's last menstrual period was 03/08/2017. Contraception: tubal ligation Last Pap: 2015. Results were: Normal per patient Last mammogram: 05/2016. Results were: Negative  Obstetric History OB History  Gravida Para Term Preterm AB Living  2 2       2   SAB TAB Ectopic Multiple Live Births               # Outcome Date GA Lbr Len/2nd Weight Sex Delivery Anes PTL Lv  2 Para           1 Para                ROS: A ROS was performed and pertinent positives and negatives are included in the history.  GENERAL: No fevers or chills. HEENT: No change in vision, no earache, sore throat or sinus congestion. NECK: No pain or stiffness. CARDIOVASCULAR: No chest pain or pressure. No palpitations. PULMONARY: No shortness of breath, cough or wheeze. GASTROINTESTINAL: No abdominal pain, nausea, vomiting or diarrhea, melena or bright red blood per rectum. GENITOURINARY: No urinary frequency, urgency, hesitancy or dysuria. MUSCULOSKELETAL: No joint or muscle pain, no back pain, no recent trauma. DERMATOLOGIC: No rash, no itching, no lesions. ENDOCRINE: No polyuria, polydipsia, no heat or cold intolerance. No recent change in weight. HEMATOLOGICAL: No anemia or easy bruising or bleeding. NEUROLOGIC: No headache, seizures, numbness, tingling or weakness. PSYCHIATRIC: No depression,  no loss of interest in normal activity or change in sleep pattern.     Exam:   BP 130/82   Ht 5' 0.5" (1.537 m)   Wt 169 lb (76.7 kg)   LMP 03/08/2017   BMI 32.46 kg/m   Body mass index is 32.46 kg/m.  General appearance : Well developed well nourished female. No acute distress HEENT: Eyes: no retinal hemorrhage or exudates,  Neck supple, trachea midline, no carotid bruits, no thyroidmegaly Lungs: Clear to auscultation, no rhonchi or wheezes, or rib retractions  Heart: Regular rate and rhythm, no murmurs or gallops Breast:Examined in sitting and supine position were symmetrical in appearance, no palpable masses or tenderness,  no skin retraction, no nipple inversion, no nipple discharge, no skin discoloration, no axillary or supraclavicular lymphadenopathy Abdomen: no palpable masses or tenderness, no rebound or guarding Extremities: no edema or skin discoloration or tenderness  Pelvic: Vulva normal, except resolving sebaceous gland cyst Rt vulva  Bartholin, Urethra, Skene Glands: Within normal limits             Vagina: No gross lesions or discharge  Cervix: No gross lesions or discharge  Uterus  AV, increased in size and nodular to above the umbilicus and into the right adnexa, with decreased mobility  Adnexa  Without masses or tenderness  Anus and perineum  normal   Pelvic US 03/13/2017:   T/A and T/V Uterus: Measurements: 17.2 x 9.8 x 14.2  cm. There are masses throughout the uterus. The uterus has an overall inhomogeneous echotexture. There is a mass arising from the superior fundus measuring 5.5 x 6.6 x 5.3 cm. A mass arises from the rightward aspect of the fundus measuring 3.6 x 3.9 x 3.8 cm. A mass in the region of the endometrium measures 2.8 x 2.3 x 3.4 cm. A mass to the left of the endometrium measures 2.9 x 3.3 x 3.9 cm. A mass more inferiorly along the leftward aspect the uterus measures 5.9 x 7.2 x 6.5 cm. A mass toward the rightward aspect of the fundus more  superiorly measures 2.7 x 2.4 x 3.6 cm.  Endometrium: Thickness: 18 mm. Endometrium is somewhat distorted due to leiomyoma within the endometrial region.  Neither ovary visualized by T/A or T/V, possibly due to ovarian atrophy but possibly due to displacement of the ovaries due to the enlarged leiomyomatous uterus. No extrauterine pelvic mass evident. No free fluid evident.   Assessment/Plan:  46 y.o.   1. 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  Last Hb 12.0 on 08/2016.  Patient is on FeSO4, will continue and we will repeat CBC at f/u.  Will start on Progestin only BCPs to control the menstrual flow until TAH. - CBC; Future  2. Thickened endometrium Per Pelvic US 03/13/2017, endometrial line at 18 mm, just after her period.  Will complete evaluation with an EBx at next visit.  R/O Hyperplasia/Endometrial Ca prior to TAH.  3. Female pelvic pain Very large Uterine volume d/t multiple uterine Myomas.  Overall uterine size measured at 17.2 x 9.8 x 14.2 cm by [place] 03/13/2017.  Given the large size, with IM as well as SM Myomas, the combination of Menorrhagia and abdo-pelvic pain and no desire for future fertility at 46 yo with a s/p BT/S, the decision was made to proceed with a Total Abdominal Hysterectomy, Bilateral Salpingectomy.  We will preserve the Ovaries for the benefits of continued Ovarian function.  4. Intramural, submucous, and subserous leiomyoma of uterus Very large Uterine volume d/t multiple uterine Myomas.  Overall uterine size measured at 17.2 x 9.8 x 14.2 cm by [place] 03/13/2017.  Given the large size, with IM as well as SM Myomas, the combination of Menorrhagia and abdo-pelvic pain and no desire for future fertility at 46 yo with a s/p BT/S, the decision was made to proceed with a Total Abdominal Hysterectomy, Bilateral Salpingectomy.  We will preserve the Ovaries for the benefits of continued Ovarian  function.  DepoLupron treatment was discussed, the benefits/risks/side effects/limitations were reviewed.  Counseling on above issues >50% x 45 minutes.   Princess Bruins MD, 11:55 AM 03/29/2017

## 2017-03-29 NOTE — Patient Instructions (Addendum)
1. 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  Last Hb 12.0 on 08/2016.  Patient is on FeSO4, will continue and we will repeat CBC at f/u.  Will start on Progestin only BCPs to control the menstrual flow until TAH. - CBC; Future  2. Thickened endometrium Per Pelvic US 03/13/2017, endometrial line at 18 mm, just after her period.  Will complete evaluation with an EBx at next visit.  R/O Hyperplasia/Endometrial Ca prior to TAH.  3. Female pelvic pain Very large Uterine volume d/t multiple uterine Myomas.  Overall uterine size measured at 17.2 x 9.8 x 14.2 cm by [place] 03/13/2017.  Given the large size, with IM as well as SM Myomas, the combination of Menorrhagia and abdo-pelvic pain and no desire for future fertility at 46 yo with a s/p BT/S, the decision was made to proceed with a Total Abdominal Hysterectomy, Bilateral Salpingectomy.  We will preserve the Ovaries for the benefits of continued Ovarian function.  4. Intramural, submucous, and subserous leiomyoma of uterus Very large Uterine volume d/t multiple uterine Myomas.  Overall uterine size measured at 17.2 x 9.8 x 14.2 cm by [place] 03/13/2017.  Given the large size, with IM as well as SM Myomas, the combination of Menorrhagia and abdo-pelvic pain and no desire for future fertility at 46 yo with a s/p BT/S, the decision was made to proceed with a Total Abdominal Hysterectomy, Bilateral Salpingectomy.  We will preserve the Ovaries for the benefits of continued Ovarian function.  DepoLupron treatment was discussed, the benefits/risks/side effects/limitations were reviewed.  Cheyenne Walters, it was a pleasure to meet you today!  I will see you again soon for the Endometrial Biopsy.  We will repeat the Complete Blood Count at that visit and finalize the pre-operative discussion.   Abdominal Hysterectomy Abdominal hysterectomy is a surgical procedure to remove the womb (uterus). The uterus is the  muscular organ that houses a developing baby. This surgery may be done if:  You have cancer.  You have growths (tumors or fibroids) in the uterus.  You have long-term (chronic) pain.  You are bleeding.  Your uterus has slipped down into your vagina (uterine prolapse).  You have a condition in which the tissue that lines the uterus grows outside of its normal location (endometriosis).  You have an infection in your uterus.  You are having problems with your menstrual cycle.  Depending on why you are having this procedure, you may also have other reproductive organs removed. These could include:  The part of your vagina that connects with your uterus (cervix).  The organs that make eggs (ovaries).  The tubes that connect the ovaries to the uterus (fallopian tubes).  Tell a health care provider about:  Any allergies you have.  All medicines you are taking, including vitamins, herbs, eye drops, creams, and over-the-counter medicines.  Any problems you or family members have had with anesthetic medicines.  Any blood disorders you have.  Any surgeries you have had.  Any medical conditions you have.  Whether you are pregnant or may be pregnant. What are the risks? Generally, this is a safe procedure. However, problems may occur, including:  Bleeding.  Infection.  Allergic reactions to medicines or dyes.  Damage to other structures or organs.  Nerve injury.  Decreased interest in sex or pain during sex.  Blood clots that can break free and travel to your lungs.  What happens before the procedure? Staying hydrated Follow instructions  from your health care provider about hydration, which may include:  Up to 2 hours before the procedure - you may continue to drink clear liquids, such as water, clear fruit juice, black coffee, and plain tea  Eating and drinking restrictions Follow instructions from your health care provider about eating and drinking, which may  include:  8 hours before the procedure - stop eating heavy meals or foods such as meat, fried foods, or fatty foods.  6 hours before the procedure - stop eating light meals or foods, such as toast or cereal.  6 hours before the procedure - stop drinking milk or drinks that contain milk.  2 hours before the procedure - stop drinking clear liquids.  Medicines  Ask your health care provider about: ? Changing or stopping your regular medicines. This is especially important if you are taking diabetes medicines or blood thinners. ? Taking medicines such as aspirin and ibuprofen. These medicines can thin your blood. Do not take these medicines before your procedure if your health care provider instructs you not to.  You may be given antibiotic medicine to help prevent infection. Take it as told by your health care provider.  You may be asked to take laxatives to prevent constipation. General instructions  Ask your health care provider how your surgical site will be marked or identified.  You may be asked to shower with a germ-killing soap.  Plan to have someone take you home from the hospital.  Do not use any products that contain nicotine or tobacco, such as cigarettes and e-cigarettes. If you need help quitting, ask your health care provider.  You may have an exam or testing.  You may have a blood or urine sample taken.  You may need to have an enema to clean out your rectum and lower colon.  This procedure can affect the way you feel about yourself. Talk to your health care provider about the physical and emotional changes this procedure may cause. What happens during the procedure?  To lower your risk of infection: ? Your health care team will wash or sanitize their hands. ? Your skin will be washed with soap. ? Hair may be removed from the surgical area.  An IV tube will be inserted into one of your veins.  You will be given one or more of the following: ? A medicine to  help you relax (sedative). ? A medicine to make you fall asleep (general anesthetic).  Tight-fitting (compression) stockings will be placed on your legs to promote circulation.  A thin, flexible tube (catheter) will be inserted to help drain your urine.  The surgeon will make a cut (incision) through the skin in your lower belly. The incision may go side-to-side or up-and-down.  The surgeon will move aside the body tissue that covers your uterus. The surgeon will then carefully take out your uterus along with any of the other organs that need to be removed.  Bleeding will be controlled with clamps or sutures.  The surgeon will close your incision with stitches (sutures), skin glue, or adhesive strips.  A bandage (dressing) will be placed over the incision. The procedure may vary among health care providers and hospitals. What happens after the procedure?  You will be given pain medicine as needed.  Your blood pressure, heart rate, breathing rate, and blood oxygen level will be monitored until the medicines you were given have worn off.  You will need to stay in the hospital to recover for one to  two days. Ask your health care provider how long you will need to stay in the hospital after your procedure.  You may have a liquid diet at first. You will most likely return to your usual diet the day after surgery.  You will still have the urinary catheter in place. It will likely be removed the day after surgery.  You may have to wear compression stockings. These stockings help to prevent blood clots and reduce swelling in your legs.  You will be encouraged to walk as soon as possible. You will also use a device or do breathing exercises to keep your lungs clear.  You may need to use a sanitary napkin for vaginal discharge. Summary  Abdominal hysterectomy is a surgical procedure to remove the womb (uterus). The uterus is the muscular organ that houses a developing baby.  This  procedure can affect the way you feel about yourself. Talk to your health care provider about the physical and emotional changes this procedure may cause.  You will be given medicines for pain after the procedure.  You will need to stay in the hospital to recover. Ask your health care provider how long you will need to stay in the hospital after your procedure. This information is not intended to replace advice given to you by your health care provider. Make sure you discuss any questions you have with your health care provider. Document Released: 09/16/2013 Document Revised: 08/30/2016 Document Reviewed: 08/30/2016 Elsevier Interactive Patient Education  2017 [employer].

## 2017-04-02 ENCOUNTER — Ambulatory Visit (INDEPENDENT_AMBULATORY_CARE_PROVIDER_SITE_OTHER): Payer: BLUE CROSS/BLUE SHIELD | Admitting: Obstetrics & Gynecology

## 2017-04-02 ENCOUNTER — Encounter: Payer: Self-pay | Admitting: Obstetrics & Gynecology

## 2017-04-02 VITALS — BP 140/88

## 2017-04-02 DIAGNOSIS — R938 Abnormal findings on diagnostic imaging of other specified body structures: Secondary | ICD-10-CM | POA: Diagnosis not present

## 2017-04-02 DIAGNOSIS — D252 Subserosal leiomyoma of uterus: Secondary | ICD-10-CM

## 2017-04-02 DIAGNOSIS — D251 Intramural leiomyoma of uterus: Secondary | ICD-10-CM

## 2017-04-02 DIAGNOSIS — R9389 Abnormal findings on diagnostic imaging of other specified body structures: Secondary | ICD-10-CM

## 2017-04-02 DIAGNOSIS — R102 Pelvic and perineal pain: Secondary | ICD-10-CM

## 2017-04-02 DIAGNOSIS — N920 Excessive and frequent menstruation with regular cycle: Secondary | ICD-10-CM

## 2017-04-02 DIAGNOSIS — D250 Submucous leiomyoma of uterus: Secondary | ICD-10-CM

## 2017-04-02 NOTE — Progress Notes (Signed)
    Cheyenne Walters Mar 02, 1971 [id number]        46 y.o.  G2P2   RP:  Thickened Endometrium for EBx pre TAH  HPI:  Large numerous Uterine Myomas with Abdo-pelvic pain and Menorrhagia.  Started on Progestin-only BCPs to control flow while organizing surgery.  No current vaginal bleeding.  Abdo-pelvic pressure unchanged.  Past medical history,surgical history, problem list, medications, allergies, family history and social history were all reviewed and documented in the EPIC chart.  Directed ROS with pertinent positives and negatives documented in the history of present illness/assessment and plan.  Exam:  Vitals:   04/02/17 1010  BP: 140/88   General appearance:  Normal  Endometrial Bx procedure:  Verbal informed consent obtained.  Speculum inserted in vagina.  Betadine prep of cervix/vagina.  Hurricane spray on cervix.  Cervix stenotic.  Single tooth tenaculum applied on anterior lip of cervix.  Small dilator kit used for dilation of cervix.  Endometrial Bx canula entered in IU cavity successfully.  Specimen adequate, sent to patho.  Hemostasis adequate.  All instruments removed.      Assessment/Plan:  46 y.o. G2P2   1. Thickened endometrium Successful EBx today, pending results. - Pathology Report  2. Intramural, submucous, and subserous leiomyoma of uterus Decision to proceed with Total Abdominal Hysterectomy with Bilateral Salpingectomy.  Preservation of Ovaries.  Surgery and risks reviewed thoroughly.                          Patient was counseled as to the risk of surgery to include the following:  1. Infection (prohylactic antibiotics will be administered)  2. DVT/Pulmonary Embolism (prophylactic pneumo compression stockings will be used)  3.Trauma to internal organs requiring additional surgical procedure to repair any injury to     Internal organs requiring perhaps additional hospitalization days.  4.Hemmorhage requiring transfusion and blood products which carry  risks such as anaphylactic reaction, hepatitis and AIDS  Patient had received literature information on the procedure scheduled and all her questions were answered and fully accepts all risk.  3. Female pelvic pain As above  4. Menorrhagia with regular cycle As above   Princess Bruins MD, 10:13 AM 04/02/2017

## 2017-04-04 ENCOUNTER — Other Ambulatory Visit: Payer: BLUE CROSS/BLUE SHIELD

## 2017-04-04 ENCOUNTER — Ambulatory Visit: Payer: BLUE CROSS/BLUE SHIELD | Admitting: Obstetrics & Gynecology

## 2017-04-04 LAB — PATHOLOGY

## 2017-04-07 NOTE — Patient Instructions (Signed)
1. Thickened endometrium Successful EBx today, pending results. - Pathology Report  2. Intramural, submucous, and subserous leiomyoma of uterus Decision to proceed with Total Abdominal Hysterectomy with Bilateral Salpingectomy.  Preservation of Ovaries.  Surgery and risks reviewed thoroughly.                          Patient was counseled as to the risk of surgery to include the following:  1. Infection (prohylactic antibiotics will be administered)  2. DVT/Pulmonary Embolism (prophylactic pneumo compression stockings will be used)  3.Trauma to internal organs requiring additional surgical procedure to repair any injury to     Internal organs requiring perhaps additional hospitalization days.  4.Hemmorhage requiring transfusion and blood products which carry risks such as anaphylactic reaction, hepatitis and AIDS  Patient had received literature information on the procedure scheduled and all her questions were answered and fully accepts all risk.  3. Female pelvic pain As above  4. Menorrhagia with regular cycle As above   Good to see you today Cheyenne Walters!

## 2017-04-30 ENCOUNTER — Encounter: Payer: Self-pay | Admitting: Anesthesiology

## 2017-05-02 ENCOUNTER — Telehealth: Payer: Self-pay

## 2017-05-02 NOTE — Telephone Encounter (Signed)
I called patient to let her know that I received her disability form from [facility]. I explained need for signed medical records release form for me to be able to send to them. Also, explained $25 fee. Patient said she will stop by tomorrow to take care of both.

## 2017-05-03 NOTE — Patient Instructions (Addendum)
Your procedure is scheduled on:  Tuesday, May 15, 2017  Enter through the [employer] [facility] at:  9:45 AM  Pick up the phone at the desk and dial [phone].  Call this number if you have problems the morning of surgery: [phone].  Remember: Do NOT eat food or drink after:  Midnight Monday  Take these medicines the morning of surgery with a SIP OF WATER:  None  Stop ALL herbal medications at this time  Do NOT smoke the day of surgery.  Do NOT wear jewelry (body piercing), metal hair clips/bobby pins, make-up, artifical eyelashes or nail polish. Do NOT wear lotions, powders, or perfumes.  You may wear deodorant. Do NOT shave for 48 hours prior to surgery. Do NOT bring valuables to the hospital. Contacts, dentures, or bridgework may not be worn into surgery.  Leave suitcase in car.  After surgery it may be brought to your room.  For patients admitted to the hospital, checkout time is 11:00 AM the day of discharge.  Bring a copy of your healthcare power of attorney and living will documents.

## 2017-05-04 ENCOUNTER — Encounter (HOSPITAL_COMMUNITY): Payer: Self-pay

## 2017-05-04 ENCOUNTER — Encounter (HOSPITAL_COMMUNITY)
Admission: RE | Admit: 2017-05-04 | Discharge: 2017-05-04 | Disposition: A | Discharge status: Home / Self Care | Payer: BLUE CROSS/BLUE SHIELD | Source: Ambulatory Visit | Attending: Obstetrics & Gynecology | Admitting: Obstetrics & Gynecology

## 2017-05-04 DIAGNOSIS — N920 Excessive and frequent menstruation with regular cycle: Secondary | ICD-10-CM | POA: Insufficient documentation

## 2017-05-04 DIAGNOSIS — Z0183 Encounter for blood typing: Secondary | ICD-10-CM | POA: Insufficient documentation

## 2017-05-04 DIAGNOSIS — R102 Pelvic and perineal pain: Secondary | ICD-10-CM | POA: Diagnosis not present

## 2017-05-04 DIAGNOSIS — D259 Leiomyoma of uterus, unspecified: Secondary | ICD-10-CM | POA: Insufficient documentation

## 2017-05-04 DIAGNOSIS — Z0289 Encounter for other administrative examinations: Secondary | ICD-10-CM

## 2017-05-04 DIAGNOSIS — Z01812 Encounter for preprocedural laboratory examination: Secondary | ICD-10-CM | POA: Diagnosis present

## 2017-05-04 HISTORY — DX: Cardiac arrhythmia, unspecified: I49.9

## 2017-05-04 HISTORY — DX: Headache, unspecified: R51.9

## 2017-05-04 HISTORY — DX: Sarcoidosis, unspecified: D86.9

## 2017-05-04 HISTORY — DX: Disorder of the skin and subcutaneous tissue, unspecified: L98.9

## 2017-05-04 HISTORY — DX: Headache: R51

## 2017-05-04 LAB — ABO/RH: ABO/RH(D): B NEG

## 2017-05-04 LAB — CBC
HEMATOCRIT: 34.1 % — AB (ref 36.0–46.0)
HEMOGLOBIN: 11.2 g/dL — AB (ref 12.0–15.0)
MCH: 28.4 pg (ref 26.0–34.0)
MCHC: 32.8 g/dL (ref 30.0–36.0)
MCV: 86.3 fL (ref 78.0–100.0)
Platelets: 224 10*3/uL (ref 150–400)
RBC: 3.95 MIL/uL (ref 3.87–5.11)
RDW: 14 % (ref 11.5–15.5)
WBC: 3.5 10*3/uL — AB (ref 4.0–10.5)

## 2017-05-04 LAB — TYPE AND SCREEN
ABO/RH(D): B NEG
ANTIBODY SCREEN: NEGATIVE

## 2017-05-09 ENCOUNTER — Encounter: Payer: Self-pay | Admitting: Anesthesiology

## 2017-05-15 ENCOUNTER — Inpatient Hospital Stay (HOSPITAL_COMMUNITY): Payer: BLUE CROSS/BLUE SHIELD | Admitting: Anesthesiology

## 2017-05-15 ENCOUNTER — Encounter (HOSPITAL_COMMUNITY): Payer: Self-pay | Admitting: *Deleted

## 2017-05-15 ENCOUNTER — Inpatient Hospital Stay (HOSPITAL_COMMUNITY)
Admission: RE | Admit: 2017-05-15 | Discharge: 2017-05-17 | DRG: 743 | Disposition: A | Discharge status: Home / Self Care | Payer: BLUE CROSS/BLUE SHIELD | Source: Ambulatory Visit | Attending: Obstetrics & Gynecology | Admitting: Obstetrics & Gynecology

## 2017-05-15 ENCOUNTER — Encounter (HOSPITAL_COMMUNITY)
Admission: RE | Disposition: A | Discharge status: Home / Self Care | Payer: Self-pay | Source: Ambulatory Visit | Attending: Obstetrics & Gynecology

## 2017-05-15 DIAGNOSIS — D252 Subserosal leiomyoma of uterus: Secondary | ICD-10-CM | POA: Diagnosis present

## 2017-05-15 DIAGNOSIS — R102 Pelvic and perineal pain: Secondary | ICD-10-CM | POA: Diagnosis present

## 2017-05-15 DIAGNOSIS — D250 Submucous leiomyoma of uterus: Secondary | ICD-10-CM | POA: Diagnosis present

## 2017-05-15 DIAGNOSIS — D251 Intramural leiomyoma of uterus: Secondary | ICD-10-CM | POA: Diagnosis present

## 2017-05-15 DIAGNOSIS — N920 Excessive and frequent menstruation with regular cycle: Secondary | ICD-10-CM | POA: Diagnosis present

## 2017-05-15 DIAGNOSIS — Z9889 Other specified postprocedural states: Secondary | ICD-10-CM

## 2017-05-15 DIAGNOSIS — R938 Abnormal findings on diagnostic imaging of other specified body structures: Secondary | ICD-10-CM | POA: Diagnosis present

## 2017-05-15 DIAGNOSIS — K219 Gastro-esophageal reflux disease without esophagitis: Secondary | ICD-10-CM | POA: Diagnosis present

## 2017-05-15 HISTORY — PX: HYSTERECTOMY ABDOMINAL WITH SALPINGECTOMY: SHX6725

## 2017-05-15 LAB — PREGNANCY, URINE: Preg Test, Ur: NEGATIVE

## 2017-05-15 SURGERY — HYSTERECTOMY, TOTAL, ABDOMINAL, WITH SALPINGECTOMY
Anesthesia: General | Laterality: Bilateral

## 2017-05-15 MED ORDER — LIDOCAINE HCL (CARDIAC) 20 MG/ML IV SOLN
INTRAVENOUS | Status: DC | PRN
  Administered 2017-05-15: 80 mg via INTRAVENOUS

## 2017-05-15 MED ORDER — FENTANYL CITRATE (PF) 250 MCG/5ML IJ SOLN
INTRAMUSCULAR | Status: AC
  Filled 2017-05-15: qty 5

## 2017-05-15 MED ORDER — ONDANSETRON HCL 4 MG/2ML IJ SOLN
INTRAMUSCULAR | Status: DC | PRN
  Administered 2017-05-15: 4 mg via INTRAVENOUS

## 2017-05-15 MED ORDER — OXYCODONE-ACETAMINOPHEN 5-325 MG PO TABS
1.0000 | ORAL_TABLET | ORAL | Status: DC | PRN
  Administered 2017-05-16 – 2017-05-17 (×4): 1 via ORAL
  Filled 2017-05-15 (×4): qty 1

## 2017-05-15 MED ORDER — PROMETHAZINE HCL 25 MG/ML IJ SOLN: 6.2500 mg | INTRAMUSCULAR | Status: DC | PRN

## 2017-05-15 MED ORDER — BUPIVACAINE LIPOSOME 1.3 % IJ SUSP
20.0000 mL | Freq: Once | INTRAMUSCULAR | Status: AC
  Administered 2017-05-15: 20 mL
  Filled 2017-05-15: qty 20

## 2017-05-15 MED ORDER — ROCURONIUM BROMIDE 100 MG/10ML IV SOLN
INTRAVENOUS | Status: AC
  Filled 2017-05-15: qty 1

## 2017-05-15 MED ORDER — HYDROMORPHONE HCL 1 MG/ML IJ SOLN
INTRAMUSCULAR | Status: DC | PRN
  Administered 2017-05-15: 1 mg via INTRAVENOUS

## 2017-05-15 MED ORDER — SODIUM CHLORIDE 0.9 % IR SOLN
Status: DC | PRN
  Administered 2017-05-15: 30 mL

## 2017-05-15 MED ORDER — SCOPOLAMINE 1 MG/3DAYS TD PT72
MEDICATED_PATCH | TRANSDERMAL | Status: AC
  Filled 2017-05-15: qty 1

## 2017-05-15 MED ORDER — HYDROMORPHONE HCL 1 MG/ML IJ SOLN
INTRAMUSCULAR | Status: AC
  Filled 2017-05-15: qty 1

## 2017-05-15 MED ORDER — SODIUM CHLORIDE 0.9 % IJ SOLN
INTRAMUSCULAR | Status: AC
  Filled 2017-05-15: qty 10

## 2017-05-15 MED ORDER — PROPOFOL 10 MG/ML IV BOLUS
INTRAVENOUS | Status: DC | PRN
  Administered 2017-05-15: 200 mg via INTRAVENOUS

## 2017-05-15 MED ORDER — SUGAMMADEX SODIUM 200 MG/2ML IV SOLN
INTRAVENOUS | Status: DC | PRN
  Administered 2017-05-15: 200 mg via INTRAVENOUS

## 2017-05-15 MED ORDER — ROCURONIUM BROMIDE 100 MG/10ML IV SOLN
INTRAVENOUS | Status: DC | PRN
  Administered 2017-05-15: 10 mg via INTRAVENOUS
  Administered 2017-05-15: 40 mg via INTRAVENOUS
  Administered 2017-05-15: 10 mg via INTRAVENOUS

## 2017-05-15 MED ORDER — PROPOFOL 10 MG/ML IV BOLUS
INTRAVENOUS | Status: AC
  Filled 2017-05-15: qty 20

## 2017-05-15 MED ORDER — LACTATED RINGERS IV SOLN
INTRAVENOUS | Status: DC
  Administered 2017-05-15: 1000 mL via INTRAVENOUS
  Administered 2017-05-15: 11:00:00 via INTRAVENOUS

## 2017-05-15 MED ORDER — KETOROLAC TROMETHAMINE 30 MG/ML IJ SOLN
INTRAMUSCULAR | Status: DC | PRN
  Administered 2017-05-15: 30 mg via INTRAVENOUS

## 2017-05-15 MED ORDER — FENTANYL CITRATE (PF) 100 MCG/2ML IJ SOLN
INTRAMUSCULAR | Status: DC | PRN
  Administered 2017-05-15 (×7): 50 ug via INTRAVENOUS

## 2017-05-15 MED ORDER — IBUPROFEN 600 MG PO TABS
600.0000 mg | ORAL_TABLET | Freq: Four times a day (QID) | ORAL | Status: DC | PRN
  Administered 2017-05-15 – 2017-05-17 (×4): 600 mg via ORAL
  Filled 2017-05-15 (×4): qty 1

## 2017-05-15 MED ORDER — CEFAZOLIN SODIUM-DEXTROSE 2-4 GM/100ML-% IV SOLN
2.0000 g | INTRAVENOUS | Status: AC
  Administered 2017-05-15: 2 g via INTRAVENOUS

## 2017-05-15 MED ORDER — MIDAZOLAM HCL 2 MG/2ML IJ SOLN
INTRAMUSCULAR | Status: DC | PRN
  Administered 2017-05-15 (×2): 1 mg via INTRAVENOUS

## 2017-05-15 MED ORDER — MIDAZOLAM HCL 2 MG/2ML IJ SOLN
INTRAMUSCULAR | Status: AC
  Filled 2017-05-15: qty 2

## 2017-05-15 MED ORDER — LIDOCAINE HCL (CARDIAC) 20 MG/ML IV SOLN
INTRAVENOUS | Status: AC
  Filled 2017-05-15: qty 5

## 2017-05-15 MED ORDER — SCOPOLAMINE 1 MG/3DAYS TD PT72
1.0000 | MEDICATED_PATCH | Freq: Once | TRANSDERMAL | Status: DC
  Administered 2017-05-15: 1.5 mg via TRANSDERMAL

## 2017-05-15 MED ORDER — BUPIVACAINE HCL (PF) 0.25 % IJ SOLN
INTRAMUSCULAR | Status: AC
  Filled 2017-05-15: qty 30

## 2017-05-15 MED ORDER — SODIUM CHLORIDE 0.9 % IJ SOLN
INTRAMUSCULAR | Status: AC
  Filled 2017-05-15: qty 20

## 2017-05-15 MED ORDER — HYDROMORPHONE HCL 1 MG/ML IJ SOLN: 0.2500 mg | INTRAMUSCULAR | Status: DC | PRN

## 2017-05-15 MED ORDER — DEXAMETHASONE SODIUM PHOSPHATE 4 MG/ML IJ SOLN
INTRAMUSCULAR | Status: AC
  Filled 2017-05-15: qty 1

## 2017-05-15 MED ORDER — BUPIVACAINE HCL (PF) 0.25 % IJ SOLN
INTRAMUSCULAR | Status: DC | PRN
  Administered 2017-05-15: 10 mL

## 2017-05-15 MED ORDER — HYDROMORPHONE HCL 1 MG/ML IJ SOLN
0.5000 mg | INTRAMUSCULAR | Status: DC | PRN
  Administered 2017-05-15 – 2017-05-16 (×4): 0.5 mg via INTRAVENOUS
  Filled 2017-05-15 (×4): qty 0.5

## 2017-05-15 MED ORDER — LACTATED RINGERS IV SOLN
INTRAVENOUS | Status: DC
  Administered 2017-05-15 (×2): via INTRAVENOUS

## 2017-05-15 MED ORDER — DEXAMETHASONE SODIUM PHOSPHATE 10 MG/ML IJ SOLN
INTRAMUSCULAR | Status: DC | PRN
  Administered 2017-05-15: 8 mg via INTRAVENOUS

## 2017-05-15 MED ORDER — ONDANSETRON HCL 4 MG/2ML IJ SOLN
INTRAMUSCULAR | Status: AC
  Filled 2017-05-15: qty 2

## 2017-05-15 SURGICAL SUPPLY — 38 items
CANISTER SUCT 3000ML PPV (MISCELLANEOUS) ×3 IMPLANT
CLOTH BEACON ORANGE TIMEOUT ST (SAFETY) ×3 IMPLANT
DECANTER SPIKE VIAL GLASS SM (MISCELLANEOUS) IMPLANT
DRAPE CESAREAN BIRTH W POUCH (DRAPES) ×3 IMPLANT
DRAPE WARM FLUID 44X44 (DRAPE) IMPLANT
DRSG OPSITE POSTOP 4X10 (GAUZE/BANDAGES/DRESSINGS) ×3 IMPLANT
DURAPREP 26ML APPLICATOR (WOUND CARE) ×3 IMPLANT
GAUZE SPONGE 4X4 16PLY XRAY LF (GAUZE/BANDAGES/DRESSINGS) ×3 IMPLANT
GLOVE BIO SURGEON STRL SZ 6.5 (GLOVE) ×2 IMPLANT
GLOVE BIO SURGEONS STRL SZ 6.5 (GLOVE) ×1
GLOVE BIOGEL PI IND STRL 7.0 (GLOVE) ×4 IMPLANT
GLOVE BIOGEL PI INDICATOR 7.0 (GLOVE) ×8
GOWN STRL REUS W/TWL LRG LVL3 (GOWN DISPOSABLE) ×9 IMPLANT
NEEDLE HYPO 22GX1.5 SAFETY (NEEDLE) ×6 IMPLANT
PACK ABDOMINAL GYN (CUSTOM PROCEDURE TRAY) ×3 IMPLANT
PAD OB MATERNITY 4.3X12.25 (PERSONAL CARE ITEMS) ×3 IMPLANT
PROTECTOR NERVE ULNAR (MISCELLANEOUS) ×3 IMPLANT
SPONGE LAP 18X18 X RAY DECT (DISPOSABLE) ×12 IMPLANT
STAPLER VISISTAT 35W (STAPLE) ×3 IMPLANT
SUT PLAIN 3 0 CT 1 27 (SUTURE) IMPLANT
SUT PROLENE 3 0 SH DA (SUTURE) IMPLANT
SUT VIC AB 0 CT1 18XCR BRD8 (SUTURE) ×2 IMPLANT
SUT VIC AB 0 CT1 27 (SUTURE) ×8
SUT VIC AB 0 CT1 27XBRD ANBCTR (SUTURE) ×4 IMPLANT
SUT VIC AB 0 CT1 36 (SUTURE) ×6 IMPLANT
SUT VIC AB 0 CT1 8-18 (SUTURE) ×4
SUT VIC AB 2-0 SH 27 (SUTURE)
SUT VIC AB 2-0 SH 27XBRD (SUTURE) IMPLANT
SUT VIC AB 3-0 SH 27 (SUTURE)
SUT VIC AB 3-0 SH 27X BRD (SUTURE) IMPLANT
SUT VIC AB 4-0 PS2 27 (SUTURE) ×3 IMPLANT
SUT VICRYL 0 TIES 12 18 (SUTURE) ×3 IMPLANT
SUT VICRYL 1 TIES 12X18 (SUTURE) IMPLANT
SUT VICRYL 3 0 BR 18  UND (SUTURE)
SUT VICRYL 3 0 BR 18 UND (SUTURE) IMPLANT
SYR CONTROL 10ML LL (SYRINGE) ×6 IMPLANT
TOWEL OR 17X24 6PK STRL BLUE (TOWEL DISPOSABLE) ×6 IMPLANT
TRAY FOLEY CATH SILVER 14FR (SET/KITS/TRAYS/PACK) ×3 IMPLANT

## 2017-05-15 NOTE — Anesthesia Procedure Notes (Signed)
Procedure Name: Intubation Date/Time: 05/15/2017 10:31 AM Performed by: Georgeanne Nim Pre-anesthesia Checklist: Patient identified, Emergency Drugs available, Suction available and Patient being monitored Patient Re-evaluated:Patient Re-evaluated prior to induction Oxygen Delivery Method: Circle system utilized Preoxygenation: Pre-oxygenation with 100% oxygen Induction Type: IV induction and Cricoid Pressure applied Ventilation: Mask ventilation without difficulty Laryngoscope Size: Mac and 3 Grade View: Grade I Tube type: Oral Tube size: 7.0 mm Number of attempts: 1 Airway Equipment and Method: Stylet Placement Confirmation: ETT inserted through vocal cords under direct vision,  positive ETCO2,  CO2 detector and breath sounds checked- equal and bilateral Secured at: 21 cm Tube secured with: Tape Dental Injury: Teeth and Oropharynx as per pre-operative assessment  Comments: Performed by Jackquline Bosch SRNA

## 2017-05-15 NOTE — H&P (Addendum)
Saliha Salts is an 46 y.o. female G2P2 BT/S  RP:  Large Myomatous Uterus with Menorrhagia and abdominopelvic pain for TAH/bilateral Salpingectomy  HPI:  No change x last office visit.  Large numerous Uterine Myomas with Abdo-pelvic pain and Menorrhagia.  Started on Progestin-only BCPs to control flow while organizing surgery.  No current vaginal bleeding.  Abdo-pelvic pressure unchanged.  Pertinent Gynecological History: Menses: Heavy periods Contraception: oral progesterone-only contraceptive/BT/S Blood transfusions: none Sexually transmitted diseases: no past history Last mammogram: Normal Last pap: normal OB History: G2P2   Menstrual History:  Patient's last menstrual period was 04/30/2017 (exact date).    Past Medical History:  Diagnosis Date  . Anemia   . Bumps on skin    left lower abdomen  . Chicken pox   . GERD (gastroesophageal reflux disease)   . Headache    Migraines  . Irregular heart beat   . Sarcoidosis     Past Surgical History:  Procedure Laterality Date  . COLONOSCOPY    . TUBAL LIGATION      Family History  Problem Relation Age of Onset  . Alcohol abuse Father   . Hypertension Maternal Grandmother   . Diabetes Maternal Grandmother   . Hypertension Maternal Grandfather   . Diabetes Sister   . Cancer Neg Hx        colon or gyn    Social History:  reports that she has never smoked. She has never used smokeless tobacco. She reports that she drinks alcohol. She reports that she does not use drugs.  Allergies: No Known Allergies  No prescriptions prior to admission.    ROS Neg  Last menstrual period 04/30/2017. Physical Exam   Vital Signs    Report    08/20 0700 08/21 0659 08/21 0700 08/21 1021  Most Recent    Temp (F)   97.9  97.9 (36.6)  08/21 0910  Pulse Rate   90  90  08/21 0910  Resp   18  18  08/21 0910  BP   126/79  126/79  08/21 0910  SpO2 (%)   100  100  08/21 0910    See office notes  Hb 11.2  05/04/2017  Pelvic US 03/13/2016 T/A and T/V Uterus:  Measurements: 17.2 x 9.8 x 14.2 cm. There are masses throughout the uterus. The uterus has an overall inhomogeneous echotexture. There is a mass arising from the superior fundus measuring 5.5 x 6.6 x 5.3 cm. A mass arises from the rightward aspect of the fundus measuring 3.6 x 3.9 x 3.8 cm. A mass in the region of the endometrium measures 2.8 x 2.3 x 3.4 cm. A mass to the left of the endometrium measures 2.9 x 3.3 x 3.9 cm. A mass more inferiorly along the leftward aspect the uterus measures 5.9 x 7.2 x 6.5 cm. A mass toward the rightward aspect of the fundus more superiorly measures 2.7 x 2.4 x 3.6 cm. Endometrium:  Thickness: 18 mm. Endometrium is somewhat distorted due to leiomyoma within the endometrial region.  Assessment/Plan:  1. Thickened endometrium Successful EBx. Endometrium - Biopsy:Fragments of mid secretory endometrium.Negative for hyperplasia, atypia or malignancy.    2. Intramural, submucous, and subserous leiomyoma of uterus Decision to proceed with Total Abdominal Hysterectomy with Bilateral Salpingectomy.  Preservation of Ovaries.  Surgery and risks reviewed thoroughly.                          Patient was counseled  as to the risk of surgery to include the following:  1. Infection (prohylactic antibiotics will be administered)  2. DVT/Pulmonary Embolism (prophylactic pneumo compression stockings will be used)  3.Trauma to internal organs requiring additional surgical procedure to repair any injury to     Internal organs requiring perhaps additional hospitalization days.  4.Hemmorhage requiring transfusion and blood products which carry risks such as anaphylactic reaction, hepatitis and AIDS  Patient had received literature information on the procedure scheduled and all her questions were answered and fully accepts all risk.  3. Female pelvic pain As above  4. Menorrhagia with regular cycle As  above   [facility] 05/15/2017, 6:28 AM

## 2017-05-15 NOTE — Anesthesia Postprocedure Evaluation (Signed)
Anesthesia Post Note  Patient: Cheyenne Walters  Procedure(s) Performed: Procedure(s) (LRB): HYSTERECTOMY ABDOMINAL WITH SALPINGECTOMY (Bilateral)     Patient location during evaluation: PACU Anesthesia Type: General Level of consciousness: awake and alert Pain management: pain level controlled Vital Signs Assessment: post-procedure vital signs reviewed and stable Respiratory status: spontaneous breathing, nonlabored ventilation, respiratory function stable and patient connected to nasal cannula oxygen Cardiovascular status: blood pressure returned to baseline and stable Postop Assessment: no signs of nausea or vomiting Anesthetic complications: no    Last Vitals:  Vitals:   05/15/17 1315 05/15/17 1330  BP: (!) 146/90 (!) 147/90  Pulse: 70 72  Resp: 10 12  Temp:    SpO2: 100% 100%    Last Pain:  Vitals:   05/15/17 0910  TempSrc: Oral   Pain Goal:                 [name] A.

## 2017-05-15 NOTE — OR Nursing (Signed)
SPECIMEN WEIGHED IN OR-- 1000.3 GRAMS

## 2017-05-15 NOTE — Anesthesia Preprocedure Evaluation (Signed)
Anesthesia Evaluation  Patient identified by MRN, date of birth, ID band Patient awake    Reviewed: Allergy & Precautions, NPO status , Patient's Chart, lab work & pertinent test results  History of Anesthesia Complications Negative for: history of anesthetic complications  Airway Mallampati: II  TM Distance: >3 FB     Dental no notable dental hx. (+) Dental Advisory Given   Pulmonary neg pulmonary ROS,    Pulmonary exam normal        Cardiovascular hypertension, + angina Normal cardiovascular exam     Neuro/Psych negative neurological ROS  negative psych ROS   GI/Hepatic Neg liver ROS, GERD  ,  Endo/Other  negative endocrine ROS  Renal/GU negative Renal ROS  negative genitourinary   Musculoskeletal negative musculoskeletal ROS (+)   Abdominal   Peds negative pediatric ROS (+)  Hematology negative hematology ROS (+)   Anesthesia Other Findings   Reproductive/Obstetrics negative OB ROS                             Anesthesia Physical Anesthesia Plan  ASA: II  Anesthesia Plan: General   Post-op Pain Management:    Induction: Intravenous  PONV Risk Score and Plan: 3 and Ondansetron, Dexamethasone and Scopolamine patch - Pre-op  Airway Management Planned: Oral ETT  Additional Equipment:   Intra-op Plan:   Post-operative Plan: Extubation in OR  Informed Consent: I have reviewed the patients History and Physical, chart, labs and discussed the procedure including the risks, benefits and alternatives for the proposed anesthesia with the patient or authorized representative who has indicated his/her understanding and acceptance.   Dental advisory given  Plan Discussed with: CRNA, Anesthesiologist and Surgeon  Anesthesia Plan Comments:         Anesthesia Quick Evaluation

## 2017-05-15 NOTE — Transfer of Care (Signed)
Immediate Anesthesia Transfer of Care Note  Patient: Cheyenne Walters  Procedure(s) Performed: Procedure(s) with comments: HYSTERECTOMY ABDOMINAL WITH SALPINGECTOMY (Bilateral) - request to follow around 11:30am  requests 1 1/2 hours for the case.  Patient Location: PACU  Anesthesia Type:General  Level of Consciousness: awake and patient cooperative  Airway & Oxygen Therapy: Patient Spontanous Breathing and Patient connected to nasal cannula oxygen  Post-op Assessment: Report given to RN and Post -op Vital signs reviewed and stable  Post vital signs: Reviewed and stable  Last Vitals:  Vitals:   05/15/17 0910  BP: 126/79  Pulse: 90  Resp: 18  Temp: 36.6 C  SpO2: 100%    Last Pain:  Vitals:   05/15/17 0910  TempSrc: Oral         Complications: No apparent anesthesia complications

## 2017-05-15 NOTE — Op Note (Signed)
Operative Note  05/15/2017  12:34 PM  PATIENT:  Cheyenne Walters  46 y.o. female  PRE-OPERATIVE DIAGNOSIS:  Large uterine fibroids, menorrhagia, abdomino-pelvic pain  POST-OPERATIVE DIAGNOSIS:  Large uterine fibroids, menorrhagia, abdomino-pelvic pain  PROCEDURE:  Procedure(s): HYSTERECTOMY ABDOMINAL WITH BILATERAL SALPINGECTOMY  SURGEON:  Surgeon(s): Princess Bruins, MD Fontaine, Belinda Block, MD  ANESTHESIA:   general  FINDINGS:  Large Myomatous Uterus  DESCRIPTION OF OPERATION:  Under general anesthesia with endotracheal intubation the patient is in decubitus dorsal position. She is prepped with DuraPrep on the abdomen and with Betadine on the suprapubic and vulvar areas. A Foley is put in place in the bladder.  She is draped as usual. The timeout is done.  The subcutaneous tissue was infiltrated with Marcaine one quarter plain 10 cc.  A Pfannenstiel incision is done with the scalpel.  The adipose tissue was opened with the electrocautery.  The aponeurosis is opened transversely with the electrocautery and extended on each side with Mayo scissors.  The upper aspect of the aponeurosis is grasped with cokers. The recti muscles are separated from the aponeurosis on the midline.  The parietal peritoneum is opened with Metzenbaums scissors longitudinally.  The inferior aspect of the aponeurosis is grasped with cokers and the recti muscle are separated from the aponeurosis at the inferior aspect.  The uterus is exteriorized.  It measures about 18 cm with multiple large fibroids.  Both ovaries are normal in size and appearance. Both tubes are normal as well.  The Quillian Quince retractor is put in place with the bladder retractor and the posterior blade with 2 laps.  Kelly's are clamped on either side of the uterus at the cornua.  We used a back up to grasp the left round ligament.  The left round ligament is sutured with a Vicryl 0 and sectioned with the Bovie.  The visceral peritoneum is  opened anteriorly and the bladder is lowered.  A window is made in the left broad ligament, curved Heaney eighth are used to clamp the proximal aspect of the left tube and the left utero-ovarian ligament.  We section in between with the Mayo scissors. We suture with a Vicryl 0.  After skeletonizing the left uterine artery, it is clamped with a curved Heaney clamp, a straight Heaney is applied for backflow.  We section with Mayo scissors. We suture with a Vicryl 0.  We proceed exactly the same way on the right side.  The bladder is further descended past the cervix.  We then section the uterus just above the sutured uterine arteries at the level of the junction of the cervix and uterus.  The uterus with all the fibroids are removed from the surgical field.  The upper aspect of the cervix is grasped with 2 Coker clamps.  We descended on either side of the cervix with straight Heaneys, we section with the scalpel and we suture with a Heaney stitch of Vicryl 0.  The uterosacral ligaments are included in those bites.  We reached the angle of the vagina.  It is clamped on both sides with curved Heaneys.  We section with the Jorgenson scissors.  The cervix is added to the specimen.  We use a Heaney stitch of Vicryl 0 on either side to close the angles.  We then completed the closure of the vagina with figure-of-eight's of Vicryl 0.  Hemostasis is adequate at all levels.  We proceed with the bilateral salpingectomy.  The left mesosalpinx is clamped with a straight  Heaney.  We section with the Bovie and suture with a Vicryl 0.  We proceed the same way on the right side. The uterus with cervix and both tubes are sent to pathology.  We irrigated and suctioned the pelvic cavity.  Hemostasis is confirmed again.  The retractor is removed with all laps.  Hemostasis was completed on the recti muscles with the Bovie.  We closed the aponeurosis with 2 half running sutures of Vicryl 0.  The adipose tissue is approximated with a  running suture of plain 2-0.  The skin is closed with a subcuticular suture of Vicryl 4-0 and Dermabond was added. A honeycombed  and compressive dressing are applied.  The patient is brought to recovery room in good and stable status.  ESTIMATED BLOOD LOSS: 200 cc   Intake/Output Summary (Last 24 hours) at 05/15/17 1234 Last data filed at 05/15/17 1217  Gross per 24 hour  Intake             1900 ml  Output              400 ml  Net             1500 ml     BLOOD ADMINISTERED:none   LOCAL MEDICATIONS USED:  MARCAINE/Experel   SPECIMEN:  Source of Specimen:  Uterus with cervix and bilateral tubes  DISPOSITION OF SPECIMEN:  PATHOLOGY  COUNTS:  YES  PLAN OF CARE: Transfer to PACU  Marie-Lyne LavoieMD12:34 PM

## 2017-05-16 ENCOUNTER — Encounter (HOSPITAL_COMMUNITY): Payer: Self-pay | Admitting: Obstetrics & Gynecology

## 2017-05-16 LAB — CBC
HCT: 31.3 % — ABNORMAL LOW (ref 36.0–46.0)
HEMOGLOBIN: 10.6 g/dL — AB (ref 12.0–15.0)
MCH: 29.1 pg (ref 26.0–34.0)
MCHC: 33.9 g/dL (ref 30.0–36.0)
MCV: 86 fL (ref 78.0–100.0)
Platelets: 283 10*3/uL (ref 150–400)
RBC: 3.64 MIL/uL — ABNORMAL LOW (ref 3.87–5.11)
RDW: 14 % (ref 11.5–15.5)
WBC: 9.4 10*3/uL (ref 4.0–10.5)

## 2017-05-16 NOTE — Addendum Note (Signed)
Addendum  created 05/16/17 0800 by Jonna Munro, CRNA   Sign clinical note

## 2017-05-16 NOTE — Progress Notes (Signed)
POD#1 TAH/Bilateral Salpingectomy  Subjective: Patient reports no problems voiding, ambulating, fluids tolerated.  Pain controled with Meds.  Objective: I have reviewed patient's vital signs.  vital signs, intake and output, medications and labs.  Vitals:   05/16/17 0023 05/16/17 0446  BP: (!) 144/75 114/88  Pulse: 71 74  Resp: 16 16  Temp: 99.4 F (37.4 C) 98.9 F (37.2 C)  SpO2: 100% 98%   I/O last 3 completed shifts: In: 2300 [I.V.:2300] Out: 1575 [Urine:1375; Blood:200] No intake/output data recorded.  Results for orders placed or performed during the hospital encounter of 05/15/17 (from the past 24 hour(s))  Pregnancy, urine     Status: None   Collection Time: 05/15/17  9:09 AM  Result Value Ref Range   Preg Test, Ur NEGATIVE NEGATIVE  CBC     Status: Abnormal   Collection Time: 05/16/17  5:09 AM  Result Value Ref Range   WBC 9.4 4.0 - 10.5 K/uL   RBC 3.64 (L) 3.87 - 5.11 MIL/uL   Hemoglobin 10.6 (L) 12.0 - 15.0 g/dL   HCT 31.3 (L) 36.0 - 46.0 %   MCV 86.0 78.0 - 100.0 fL   MCH 29.1 26.0 - 34.0 pg   MCHC 33.9 30.0 - 36.0 g/dL   RDW 14.0 11.5 - 15.5 %   Platelets 283 150 - 400 K/uL    EXAM General: alert and cooperative Resp: clear to auscultation bilaterally Cardio: regular rate and rhythm GI: soft, non-tender; bowel sounds normal; no masses,  no organomegaly.  Incision dressing dry. Extremities: no edema, redness or tenderness in the calves or thighs Vaginal Bleeding: none  Assessment: s/p Procedure(s): HYSTERECTOMY ABDOMINAL WITH SALPINGECTOMY: stable, progressing well and tolerating diet  Plan: Advance diet Encourage ambulation Advance to PO medication Discontinue IV fluids  LOS: 1 day    Princess Bruins, MD 05/16/2017 8:16 AM

## 2017-05-16 NOTE — Anesthesia Postprocedure Evaluation (Signed)
Anesthesia Post Note  Patient: Cheyenne Walters  Procedure(s) Performed: Procedure(s) (LRB): HYSTERECTOMY ABDOMINAL WITH SALPINGECTOMY (Bilateral)     Patient location during evaluation: [facility] Anesthesia Type: General Level of consciousness: awake and alert and oriented Pain management: pain level controlled Vital Signs Assessment: post-procedure vital signs reviewed and stable Respiratory status: spontaneous breathing, respiratory function stable and nonlabored ventilation Cardiovascular status: stable Postop Assessment: no signs of nausea or vomiting and adequate PO intake Anesthetic complications: no    Last Vitals:  Vitals:   05/16/17 0023 05/16/17 0446  BP: (!) 144/75 114/88  Pulse: 71 74  Resp: 16 16  Temp: 37.4 C 37.2 C  SpO2: 100% 98%    Last Pain:  Vitals:   05/16/17 0614  TempSrc:   PainSc: Asleep   Pain Goal: Patients Stated Pain Goal: 4 (05/16/17 0119)               [name]

## 2017-05-17 MED ORDER — OXYCODONE-ACETAMINOPHEN 7.5-325 MG PO TABS: 1.0000 | ORAL_TABLET | ORAL | 0 refills | Status: DC | PRN

## 2017-05-17 NOTE — Discharge Summary (Signed)
Physician Discharge Summary  Patient ID: Cheyenne Walters MRN: [id number] DOB/AGE: 11-04-1970 46 y.o.  Admit date: 05/15/2017 Discharge date: 05/17/2017  Admission Diagnoses: Large Uterine Fibroids, Menorrhagia, Abdominopelvic pain   Discharge Diagnoses: Large Uterine Fibroids, Menorrhagia, Abdominopelvic pain Active Problems:   Postoperative state   Discharged Condition: good  Consults:None  Significant Diagnostic Studies: None  Treatments:surgery: Total Abdominal Hysterectomy with Bilateral Salpingectomy  Vitals:   05/16/17 2020 05/17/17 0024  BP: 130/66 120/69  Pulse: 77 72  Resp: 16 17  Temp: 99 F (37.2 C) 98.8 F (37.1 C)  SpO2: 100% 99%     Allergies as of 05/17/2017   No Known Allergies     Medication List    STOP taking these medications   norethindrone 0.35 MG tablet Commonly known as:  MICRONOR,CAMILA,ERRIN     TAKE these medications   ferrous sulfate 325 (65 FE) MG tablet Take 1 tablet (325 mg total) by mouth 2 (two) times daily with a meal. What changed:  when to take this   MULTIPLE VITAMINS/WOMENS PO Take 2 tablets by mouth daily. Gummy Vitamins   naproxen sodium 220 MG tablet Commonly known as:  ANAPROX Take 220 mg by mouth 2 (two) times daily as needed (pain cramping).   omeprazole 20 MG capsule Commonly known as:  PRILOSEC Take 1 capsule (20 mg total) by mouth daily before breakfast.   oxyCODONE-acetaminophen 7.5-325 MG tablet Commonly known as:  PERCOCET Take 1 tablet by mouth every 4 (four) hours as needed for severe pain.   vitamin C 500 MG tablet Commonly known as:  ASCORBIC ACID Take 500 mg by mouth daily.            Discharge Care Instructions        Start     Ordered   05/17/17 0000  oxyCODONE-acetaminophen (PERCOCET) 7.5-325 MG tablet  Every 4 hours PRN     05/17/17 0738       Follow-up Information    Princess Bruins, MD In 3 weeks.   Specialty:  Obstetrics and Gynecology Contact information: [address] [place] [address] [phone]           Signed: Princess Bruins 05/17/2017, 7:38 AM

## 2017-05-17 NOTE — Plan of Care (Signed)
Problem: Bowel/Gastric: Goal: Gastrointestinal status for postoperative course will improve Outcome: Progressing Bowel sounds active. Patient not yet passing gas but states "she feels it coming".   Problem: Education: Goal: Knowledge of the prescribed therapeutic regimen will improve Outcome: Adequate for Discharge Discussed and educated signs of postoperative infection.  Patient instructed how to care for incision site.  Patient ambulating and increasing activity as tolerated.    Problem: Physical Regulation: Goal: Postoperative complications will be avoided or minimized Outcome: Adequate for Discharge Patient tolerating fluids and food, voiding appropriately with active bowel sounds. Patient wearing SCDs.   Problem: Respiratory: Goal: Ability to maintain adequate ventilation will improve Outcome: Adequate for Discharge Patient maintaing adequate ventilation.  1500 reached on incentive spirometry.    Problem: Skin Integrity: Goal: Demonstration of wound healing without infection will improve Outcome: Progressing No signs of wound infection at this time.  Discussed signs and symptoms of incision infection.    Problem: Urinary Elimination: Goal: Ability to reestablish a normal urinary elimination pattern will improve Outcome: Progressing Patient voiding normally.

## 2017-05-17 NOTE — Progress Notes (Signed)
POD#2  TAH/Bilateral Salpingectomy  Subjective: Patient reports + flatus and no problems voiding.    Objective: I have reviewed patient's vital signs.  vital signs, intake and output, medications and labs.  Vitals:   05/16/17 2020 05/17/17 0024  BP: 130/66 120/69  Pulse: 77 72  Resp: 16 17  Temp: 99 F (37.2 C) 98.8 F (37.1 C)  SpO2: 100% 99%   I/O last 3 completed shifts: In: -  Out: 2125 [Urine:2125] No intake/output data recorded.  No results found for this or any previous visit (from the past 24 hour(s)).  EXAM General: alert and cooperative Resp: clear to auscultation bilaterally Cardio: regular rate and rhythm GI: soft, non-tender; bowel sounds normal; no masses,  no organomegaly and incision: clean, dry and intact Extremities: no edema, redness or tenderness in the calves or thighs Vaginal Bleeding: none  Assessment: s/p Procedure(s): HYSTERECTOMY ABDOMINAL WITH SALPINGECTOMY: stable, progressing well and tolerating diet  Plan: Discharge home  LOS: 2 days    Princess Bruins, MD 05/17/2017 7:32 AM    05/17/2017, 7:32 AM

## 2017-05-17 NOTE — Discharge Instructions (Signed)
Abdominal Hysterectomy, Care After °This sheet gives you information about how to care for yourself after your procedure. Your doctor may also give you more specific instructions. If you have problems or questions, contact your doctor. °Follow these instructions at home: °Bathing °· Do not take baths, swim, or use a hot tub until your doctor says it is okay. Ask your doctor if you can take showers. You may only be allowed to take sponge baths for bathing. °· Keep the bandage (dressing) dry until your doctor says it can be taken off. °Surgical cut ( °incision) care °· Follow instructions from your doctor about how to take care of your cut from surgery. Make sure you: °? Wash your hands with soap and water before you change your bandage (dressing). If you cannot use soap and water, use hand sanitizer. °? Change your bandage as told by your doctor. °? Leave stitches (sutures), skin glue, or skin tape (adhesive) strips in place. They may need to stay in place for 2 weeks or longer. If tape strips get loose and curl up, you may trim the loose edges. Do not remove tape strips completely unless your doctor says it is okay. °· Check your surgical cut area every day for signs of infection. Check for: °? Redness, swelling, or pain. °? Fluid or blood. °? Warmth. °? Pus or a bad smell. °Activity °· Do gentle, daily exercise as told by your doctor. You may be told to take short walks every day and go farther each time. °· Do not lift anything that is heavier than 10 lb (4.5 kg), or the limit that your doctor tells you, until he or she says that it is safe. °· Do not drive or use heavy machinery while taking prescription pain medicine. °· Do not drive for 24 hours if you were given a medicine to help you relax (sedative). °· Follow your doctor's advice about exercise, driving, and general activities. Ask your doctor what activities are safe for you. °Lifestyle °· Do not douche, use tampons, or have sex for at least 6 weeks or as  told by your doctor. °· Do not drink alcohol until your doctor says it is okay. °· Drink enough fluid to keep your pee (urine) clear or pale yellow. °· Try to have someone at home with you for the first 1-2 weeks to help. °· Do not use any products that contain nicotine or tobacco, such as cigarettes and e-cigarettes. These can slow down healing. If you need help quitting, ask your doctor. °General instructions °· Take over-the-counter and prescription medicines only as told by your doctor. °· Do not take aspirin or ibuprofen. These medicines can cause bleeding. °· To prevent or treat constipation while you are taking prescription pain medicine, your doctor may suggest that you: °? Drink enough fluid to keep your urine clear or pale yellow. °? Take over-the-counter or prescription medicines. °? Eat foods that are high in fiber, such as: °§ Fresh fruits and vegetables. °§ Whole grains. °§ Beans. °? Limit foods that are high in fat and processed sugars, such as fried and sweet foods. °· Keep all follow-up visits as told by your doctor. This is important. °Contact a doctor if: °· You have chills or fever. °· You have redness, swelling, or pain around your cut. °· You have fluid or blood coming from your cut. °· Your cut feels warm to the touch. °· You have pus or a bad smell coming from your cut. °· Your cut breaks   open. °· You feel dizzy or light-headed. °· You have pain or bleeding when you pee. °· You keep having watery poop (diarrhea). °· You keep feeling sick to your stomach (nauseous) or keep throwing up (vomiting). °· You have unusual fluid (discharge) coming from your vagina. °· You have a rash. °· You have a reaction to your medicine. °· Your pain medicine does not help. °Get help right away if: °· You have a fever and your symptoms get worse all of a sudden. °· You have very bad belly (abdominal) pain. °· You are short of breath. °· You pass out (faint). °· You have pain, swelling, or redness of your  leg. °· You bleed a lot from your vagina and notice clumps of blood (clots). °Summary °· Do not take baths, swim, or use a hot tub until your doctor says it is okay. Ask your doctor if you can take showers. You may only be allowed to take sponge baths for bathing. °· Follow your doctor's advice about exercise, driving, and general activities. Ask your doctor what activities are safe for you. °· Do not lift anything that is heavier than 10 lb (4.5 kg), or the limit that your doctor tells you, until he or she says that it is safe. °· Try to have someone at home with you for the first 1-2 weeks to help. °This information is not intended to replace advice given to you by your health care provider. Make sure you discuss any questions you have with your health care provider. °Document Released: 06/20/2008 Document Revised: 08/30/2016 Document Reviewed: 08/30/2016 °Elsevier Interactive Patient Education © 2017 Else[employer] Inc[employer] ° °

## 2017-05-23 ENCOUNTER — Telehealth: Payer: Self-pay | Admitting: *Deleted

## 2017-05-23 NOTE — Telephone Encounter (Signed)
Pt husband Octavia Bruckner called has DPR access left message in triage c/o his wife being in a lot of pain. I called Tim back and asked to speak with his wife asked about her pain. Pt said she was not in pain, only soreness at her incision site, not drainage, no bleeding, no fever, no chills. Pt said she wakes up in middle of the night sometimes, but able to go back to sleep. Once again no pain per patient, pt said she only takes Perocet once or twice per day as needed. I told pt if she needs [place] we are here and to call.

## 2017-06-05 ENCOUNTER — Encounter: Payer: Self-pay | Admitting: Obstetrics & Gynecology

## 2017-06-05 ENCOUNTER — Ambulatory Visit (INDEPENDENT_AMBULATORY_CARE_PROVIDER_SITE_OTHER): Payer: BLUE CROSS/BLUE SHIELD | Admitting: Obstetrics & Gynecology

## 2017-06-05 VITALS — BP 136/84

## 2017-06-05 DIAGNOSIS — K5903 Drug induced constipation: Secondary | ICD-10-CM

## 2017-06-05 DIAGNOSIS — Z09 Encounter for follow-up examination after completed treatment for conditions other than malignant neoplasm: Secondary | ICD-10-CM

## 2017-06-05 NOTE — Patient Instructions (Signed)
1. Follow-up examination after gynecological surgery Very good post op healing.  No evidence of complication.  Pathology reviewed with patient, Benign, Fibroids.  Can resume driving.  No IC/Abdominal muscle fitness until 6 weeks post op.  2. Drug-induced constipation Postop 3 wks, not on Percocet anymore.  No Iron supplement.  Recommend to increase water intake and fibers.  Favor prunes/grapes, avoid bananas.   Continue with stool softener.  Can resume regular physical activity like walking at this point. F/U in 3 weeks for final Post op evaluation.  Janece, it was good to see you today!   Constipation, Adult Constipation is when a person has fewer bowel movements in a week than normal, has difficulty having a bowel movement, or has stools that are dry, hard, or larger than normal. Constipation may be caused by an underlying condition. It may become worse with age if a person takes certain medicines and does not take in enough fluids. Follow these instructions at home: Eating and drinking   Eat foods that have a lot of fiber, such as fresh fruits and vegetables, whole grains, and beans.  Limit foods that are high in fat, low in fiber, or overly processed, such as french fries, hamburgers, cookies, candies, and soda.  Drink enough fluid to keep your urine clear or pale yellow. General instructions  Exercise regularly or as told by your health care provider.  Go to the restroom when you have the urge to go. Do not hold it in.  Take over-the-counter and prescription medicines only as told by your health care provider. These include any fiber supplements.  Practice pelvic floor retraining exercises, such as deep breathing while relaxing the lower abdomen and pelvic floor relaxation during bowel movements.  Watch your condition for any changes.  Keep all follow-up visits as told by your health care provider. This is important. Contact a health care provider if:  You have pain that gets  worse.  You have a fever.  You do not have a bowel movement after 4 days.  You vomit.  You are not hungry.  You lose weight.  You are bleeding from the anus.  You have thin, pencil-like stools. Get help right away if:  You have a fever and your symptoms suddenly get worse.  You leak stool or have blood in your stool.  Your abdomen is bloated.  You have severe pain in your abdomen.  You feel dizzy or you faint. This information is not intended to replace advice given to you by your health care provider. Make sure you discuss any questions you have with your health care provider. Document Released: 06/09/2004 Document Revised: 03/31/2016 Document Reviewed: 03/01/2016 Elsevier Interactive Patient Education  2017 [employer].

## 2017-06-05 NOTE — Progress Notes (Signed)
    Vivi Piccirilli [facility] [birth year] [id number]        46 y.o.  G2P2 Married  RP:  Postop TAH/Bilateral Salpingectomy 05/15/2017  HPI: Doing very well.  Per patient, improving every day.  Not taking any pain medication.  Main issue is that she is moderately constipated.  Incision well closed.  Minimal vaginal d/c.  No fever.  Mictions normal.  Past medical history,surgical history, problem list, medications, allergies, family history and social history were all reviewed and documented in the EPIC chart.  Directed ROS with pertinent positives and negatives documented in the history of present illness/assessment and plan.  Exam:  Vitals:   06/05/17 0842  BP: 136/84   General appearance:  Normal  Abdo:  Soft, NT, not distended.  Incision well-healed, no sign of infection.  Gyn exam:  Vulva normal.  Speculum:  Vaginal vault intact.  Minor brownish d/c.  Assessment/Plan:  46 y.o. G2P2   1. Follow-up examination after gynecological surgery Very good post op healing.  No evidence of complication.  Pathology reviewed with patient, Benign, Fibroids.  Can resume driving.  No IC/Abdominal muscle fitness until 6 weeks post op.  2. Drug-induced constipation Postop 3 wks, not on Percocet anymore.  No Iron supplement.  Recommend to increase water intake and fibers.  Favor prunes/grapes, avoid bananas.   Continue with stool softener.  Can resume regular physical activity like walking at this point. F/U in 3 weeks for final Post op evaluation.  Princess Bruins MD, 8:53 AM 06/05/2017

## 2017-06-22 ENCOUNTER — Encounter: Payer: Self-pay | Admitting: Obstetrics & Gynecology

## 2017-06-22 ENCOUNTER — Ambulatory Visit (INDEPENDENT_AMBULATORY_CARE_PROVIDER_SITE_OTHER): Payer: BLUE CROSS/BLUE SHIELD | Admitting: Obstetrics & Gynecology

## 2017-06-22 VITALS — BP 124/84

## 2017-06-22 DIAGNOSIS — Z09 Encounter for follow-up examination after completed treatment for conditions other than malignant neoplasm: Secondary | ICD-10-CM

## 2017-06-22 NOTE — Progress Notes (Signed)
    Tyishia Aune [facility] 30-Aug-1971 [id number]        46 y.o.  G2P2   RP:  Postop TAH/Bilat Salpingectomy 8/21 at 5 wks  HPI:  Doing very well.  Some discomfort when more physically active.  No vaginal bleeding.  No abnormal vaginal d/c.  No fever.  Mictions/BMs wnl.   Past medical history,surgical history, problem list, medications, allergies, family history and social history were all reviewed and documented in the EPIC chart.  Directed ROS with pertinent positives and negatives documented in the history of present illness/assessment and plan.  Exam:  Vitals:   06/22/17 1008  BP: 124/84   General appearance:  Normal  Abdomen Soft, NT, not distended.  Pfannenstiel incision well healed.  Gyn exam:  Vulva normal.  Speculum:  Vaginal vault healing well.  No dehiscence, no bleeding, normal secretions.  Mildly tender when touching with swab.   Assessment/Plan:  46 y.o. G2P2   1. Follow-up examination after gynecological surgery Very good postop healing, no Cx.  Will resume work, physical activity and sexual activity at 8 wks Postop.  Princess Bruins MD, 10:35 AM 06/22/2017

## 2017-06-22 NOTE — Patient Instructions (Signed)
1. Follow-up examination after gynecological surgery Very good postop healing, no Cx.  Will resume work, physical activity and sexual activity at 8 wks Postop.  Cheyenne Walters, very good to see you today!  Happy about your good healing progress!

## 2017-07-12 ENCOUNTER — Encounter: Payer: Self-pay | Admitting: Anesthesiology

## 2017-09-03 ENCOUNTER — Encounter: Payer: BLUE CROSS/BLUE SHIELD | Admitting: Family Medicine

## 2017-10-01 NOTE — Progress Notes (Signed)
HPI:   Cheyenne Walters is a 47 y.o. female, who is here today for her routine physical.  Last CPE: 04/18/16 She follows with Gyn, Dr Dellis Filbert, for her routine female preventive care. S/P hysterectomy on 05/15/17.  Regular exercise 3 or more time per week: She just stated a few days ago, 10 min workout. Following a healthy diet: Not consistently due to work schedule. She lives with her husband and 2 children.  Chronic medical problems: sarcoidosis,iron def anemia,GERD,HLD.  She is taking Omeprazole 20 mg as needed.  Hyperlipidemia:  Currently on non pharmacologic treatment. Following a low fat diet: Not consistently.   Lab Results  Component Value Date   CHOL 227 (H) 09/05/2016   HDL 53.80 09/05/2016   LDLCALC 149 (H) 09/05/2016   TRIG 117.0 09/05/2016   CHOLHDL 4 09/05/2016      There is no immunization history on file for this patient.  Mammogram: 05/2016 [facility] 1. Colonoscopy: 08/2014 with Dr Sherral Hammers, diverticulosis. 10 year F/U recommended.   Iron def anemia: She is not longer on iron supplementation. She has not noted dyspnea,dizziness,or chest pain.   Lab Results  Component Value Date   WBC 9.4 05/16/2017   HGB 10.6 (L) 05/16/2017   HCT 31.3 (L) 05/16/2017   MCV 86.0 05/16/2017   PLT 283 05/16/2017    Concern about mild lower abdominal soreness, around scar after hysterectomy.  Also some vaginal dryness, she denies vaginal bleeding or discharge.  History of sarcoidosis, according to patient, skin lesions resolved completely after she stopped hair chemical treatments.  She was following with dermatologist, after lesions resolved she was recommended following as needed.  She has been asymptomatic for a few years now and not longer on pharmacologic treatment.  Review of Systems  Constitutional: Negative for appetite change, fatigue, fever and unexpected weight change.  HENT: Negative for dental problem, hearing loss, mouth sores, trouble  swallowing and voice change.   Eyes: Negative for redness and visual disturbance.  Respiratory: Negative for cough, shortness of breath and wheezing.   Cardiovascular: Negative for chest pain, palpitations and leg swelling.  Gastrointestinal: Negative for abdominal pain, blood in stool, nausea and vomiting.       No changes in bowel habits.  Endocrine: Negative for cold intolerance, heat intolerance, polydipsia, polyphagia and polyuria.  Genitourinary: Negative for decreased urine volume, dysuria, genital sores, hematuria, vaginal bleeding and vaginal discharge.  Musculoskeletal: Negative for gait problem, myalgias and neck pain.  Skin: Negative for color change and rash.  Neurological: Negative for syncope, weakness and headaches.  Hematological: Negative for adenopathy. Does not bruise/bleed easily.  Psychiatric/Behavioral: Negative for confusion and sleep disturbance. The patient is not nervous/anxious.   All other systems reviewed and are negative.     Current Outpatient Medications on File Prior to Visit  Medication Sig Dispense Refill  . Multiple Vitamins-Minerals (MULTIPLE VITAMINS/WOMENS PO) Take 2 tablets by mouth daily. Gummy Vitamins    . vitamin C (ASCORBIC ACID) 500 MG tablet Take 500 mg by mouth daily.     No current facility-administered medications on file prior to visit.      Past Medical History:  Diagnosis Date  . Anemia   . Bumps on skin    left lower abdomen  . Chicken pox   . GERD (gastroesophageal reflux disease)   . Headache    Migraines  . Irregular heart beat   . Sarcoidosis     Past Surgical History:  Procedure Laterality Date  .  COLONOSCOPY    . HYSTERECTOMY ABDOMINAL WITH SALPINGECTOMY Bilateral 05/15/2017   Procedure: HYSTERECTOMY ABDOMINAL WITH SALPINGECTOMY;  Surgeon: Princess Bruins, MD;  Location: [facility];  Service: Gynecology;  Laterality: Bilateral;  request to follow around 11:30am  requests 1 1/2 hours for the case.  . TUBAL  LIGATION      No Known Allergies  Family History  Problem Relation Age of Onset  . Alcohol abuse Father   . Hypertension Maternal Grandmother   . Diabetes Maternal Grandmother   . Hypertension Maternal Grandfather   . Diabetes Sister   . Cancer Neg Hx        colon or gyn    Social History   Socioeconomic History  . Marital status: Married    Spouse name: None  . Number of children: None  . Years of education: None  . Highest education level: None  Social Needs  . Financial resource strain: None  . Food insecurity - worry: None  . Food insecurity - inability: None  . Transportation needs - medical: None  . Transportation needs - non-medical: None  Occupational History  . None  Tobacco Use  . Smoking status: Never Smoker  . Smokeless tobacco: Never Used  Substance and Sexual Activity  . Alcohol use: Yes    Comment: OCC  . Drug use: No  . Sexual activity: Yes    Partners: Male  Other Topics Concern  . None  Social History Narrative  . None     Vitals:   10/02/17 0902  BP: 116/70  Pulse: 89  Resp: 12  Temp: 98.5 F (36.9 C)  SpO2: 98%   Body mass index is 32.49 kg/m.   Wt Readings from Last 3 Encounters:  10/02/17 166 lb 6 oz (75.5 kg)  05/15/17 166 lb 6 oz (75.5 kg)  05/04/17 166 lb 6 oz (75.5 kg)    Physical Exam  Nursing note and vitals reviewed. Constitutional: She is oriented to person, place, and time. She appears well-developed. No distress.  HENT:  Head: Normocephalic and atraumatic.  Right Ear: Hearing, tympanic membrane, external ear and ear canal normal.  Left Ear: Hearing, tympanic membrane, external ear and ear canal normal.  Mouth/Throat: Uvula is midline, oropharynx is clear and moist and mucous membranes are normal.  Eyes: Conjunctivae and EOM are normal. Pupils are equal, round, and reactive to light.  Neck: No JVD present. No tracheal deviation present. No thyromegaly present.  Cardiovascular: Normal rate and regular rhythm.    No murmur heard. Pulses:      Dorsalis pedis pulses are 2+ on the right side, and 2+ on the left side.  Respiratory: Effort normal and breath sounds normal. No respiratory distress.  GI: Soft. She exhibits no mass. There is no hepatomegaly. There is no tenderness.  Genitourinary:  Genitourinary Comments: Deferred to gynecologist.  Musculoskeletal: She exhibits no edema or tenderness.  No major deformity or signs of synovitis appreciated.  Lymphadenopathy:    She has no cervical adenopathy.       Right: No supraclavicular adenopathy present.       Left: No supraclavicular adenopathy present.  Neurological: She is alert and oriented to person, place, and time. She has normal strength. No cranial nerve deficit. Coordination and gait normal.  Reflex Scores:      Bicep reflexes are 2+ on the right side and 2+ on the left side.      Patellar reflexes are 2+ on the right side and 2+ on the left side.  Skin: Skin is warm. No rash noted. No erythema.  Psychiatric: She has a normal mood and affect. Her speech is normal.  Well groomed, good eye contact.      ASSESSMENT AND PLAN:   Ms. Chenoah was seen today for annual exam.  Diagnoses and all orders for this visit:  Lab Results  Component Value Date   CHOL 201 (H) 10/02/2017   HDL 49.10 10/02/2017   LDLCALC 129 (H) 10/02/2017   TRIG 114.0 10/02/2017   CHOLHDL 4 10/02/2017   Lab Results  Component Value Date   TSH 1.61 10/02/2017   Lab Results  Component Value Date   ALT 4 10/02/2017   AST 13 10/02/2017   ALKPHOS 87 10/02/2017   BILITOT 0.6 10/02/2017   Lab Results  Component Value Date   CREATININE 0.62 10/02/2017   BUN 10 10/02/2017   NA 138 10/02/2017   K 4.1 10/02/2017   CL 103 10/02/2017   CO2 28 10/02/2017    Routine general medical examination at a health care facility   We discussed the importance of regular physical activity and healthy diet for prevention of chronic illness and/or  complications. Preventive guidelines reviewed. Vaccination up-to-date per patient report.  She refused flu vaccine. She will continue following with her gynecologist for her female preventive care. Ca++ and vit D supplementation commended. Next CPE in a year.  The 10-year ASCVD risk score Mikey Bussing DC Brooke Bonito., et al., 2013) is: 1.2%   Values used to calculate the score:     Age: 13 years     Sex: Female     Is Non-Hispanic African American: Yes     Diabetic: No     Tobacco smoker: No     Systolic Blood Pressure: 474 mmHg     Is BP treated: No     HDL Cholesterol: 49.1 mg/dL     Total Cholesterol: 201 mg/dL   Hyperlipidemia, unspecified hyperlipidemia type  Continue nonpharmacologic treatment. Further recommendations will be given according to lab results. Follow-up in 6-12 months.  -     Lipid panel  Diabetes mellitus screening -     Comprehensive metabolic panel  Iron deficiency anemia, unspecified iron deficiency anemia type  Further recommendations will be given according to receive these results. S/p hysterectomy.  -     CBC with Differential  Sarcoidosis  In remission. Follow-up as needed.  -     TSH      Return in 1 year (on 10/02/2018) for cpe.      Devyne Hauger G. Martinique, MD  [facility]. [place] office.

## 2017-10-02 ENCOUNTER — Encounter: Payer: Self-pay | Admitting: Family Medicine

## 2017-10-02 ENCOUNTER — Ambulatory Visit (INDEPENDENT_AMBULATORY_CARE_PROVIDER_SITE_OTHER): Payer: BLUE CROSS/BLUE SHIELD | Admitting: Family Medicine

## 2017-10-02 VITALS — BP 116/70 | HR 89 | Temp 98.5°F | Resp 12 | Ht 60.0 in | Wt 166.4 lb

## 2017-10-02 DIAGNOSIS — Z0000 Encounter for general adult medical examination without abnormal findings: Secondary | ICD-10-CM | POA: Diagnosis not present

## 2017-10-02 DIAGNOSIS — Z131 Encounter for screening for diabetes mellitus: Secondary | ICD-10-CM

## 2017-10-02 DIAGNOSIS — D509 Iron deficiency anemia, unspecified: Secondary | ICD-10-CM | POA: Diagnosis not present

## 2017-10-02 DIAGNOSIS — E785 Hyperlipidemia, unspecified: Secondary | ICD-10-CM | POA: Diagnosis not present

## 2017-10-02 DIAGNOSIS — D869 Sarcoidosis, unspecified: Secondary | ICD-10-CM | POA: Diagnosis not present

## 2017-10-02 LAB — CBC WITH DIFFERENTIAL/PLATELET
BASOS PCT: 0.6 % (ref 0.0–3.0)
Basophils Absolute: 0 10*3/uL (ref 0.0–0.1)
EOS PCT: 1.3 % (ref 0.0–5.0)
Eosinophils Absolute: 0.1 10*3/uL (ref 0.0–0.7)
HCT: 40.5 % (ref 36.0–46.0)
HEMOGLOBIN: 13.1 g/dL (ref 12.0–15.0)
LYMPHS PCT: 14.6 % (ref 12.0–46.0)
Lymphs Abs: 0.7 10*3/uL (ref 0.7–4.0)
MCHC: 32.4 g/dL (ref 30.0–36.0)
MCV: 85.2 fl (ref 78.0–100.0)
Monocytes Absolute: 0.2 10*3/uL (ref 0.1–1.0)
Monocytes Relative: 4.5 % (ref 3.0–12.0)
Neutro Abs: 3.8 10*3/uL (ref 1.4–7.7)
Neutrophils Relative %: 79 % — ABNORMAL HIGH (ref 43.0–77.0)
Platelets: 224 10*3/uL (ref 150.0–400.0)
RBC: 4.75 Mil/uL (ref 3.87–5.11)
RDW: 16.3 % — AB (ref 11.5–15.5)
WBC: 4.8 10*3/uL (ref 4.0–10.5)

## 2017-10-02 LAB — COMPREHENSIVE METABOLIC PANEL
ALT: 4 U/L (ref 0–35)
AST: 13 U/L (ref 0–37)
Albumin: 4 g/dL (ref 3.5–5.2)
Alkaline Phosphatase: 87 U/L (ref 39–117)
BILIRUBIN TOTAL: 0.6 mg/dL (ref 0.2–1.2)
BUN: 10 mg/dL (ref 6–23)
CALCIUM: 9.2 mg/dL (ref 8.4–10.5)
CO2: 28 mEq/L (ref 19–32)
CREATININE: 0.62 mg/dL (ref 0.40–1.20)
Chloride: 103 mEq/L (ref 96–112)
GFR: 131.86 mL/min (ref >60.00)
Glucose, Bld: 94 mg/dL (ref 70–99)
Potassium: 4.1 mEq/L (ref 3.5–5.1)
Sodium: 138 mEq/L (ref 135–145)
Total Protein: 7.1 g/dL (ref 6.0–8.3)

## 2017-10-02 LAB — LIPID PANEL
CHOLESTEROL: 201 mg/dL — AB (ref 0–200)
HDL: 49.1 mg/dL (ref >39.00)
LDL Cholesterol: 129 mg/dL — ABNORMAL HIGH (ref 0–99)
NonHDL: 151.79
TRIGLYCERIDES: 114 mg/dL (ref 0.0–149.0)
Total CHOL/HDL Ratio: 4
VLDL: 22.8 mg/dL (ref 0.0–40.0)

## 2017-10-02 LAB — TSH: TSH: 1.61 u[IU]/mL (ref 0.35–4.50)

## 2017-10-02 NOTE — Patient Instructions (Addendum)
A few things to remember from today's visit:   Routine general medical examination at a health care facility  Hyperlipidemia, unspecified hyperlipidemia type - Plan: Lipid panel  Diabetes mellitus screening - Plan: Comprehensive metabolic panel  Iron deficiency anemia, unspecified iron deficiency anemia type - Plan: CBC with Differential  Sarcoidosis - Plan: TSH  Today you have you routine preventive visit.  At least 150 minutes of moderate exercise per week, daily brisk walking for 15-30 min is a good exercise option. Healthy diet low in saturated (animal) fats and sweets and consisting of fresh fruits and vegetables, lean meats such as fish and white chicken and whole grains.  These are some of recommendations for screening depending of age and risk factors:   - Vaccines:  Tdap vaccine every 10 years.  Shingles vaccine recommended at age 71, could be given after 49 years of age but not sure about insurance coverage.   Pneumonia vaccines:  Prevnar 13 at 65 and Pneumovax at 74. Sometimes Pneumovax is giving earlier if history of smoking, lung disease,diabetes,kidney disease among some.    Screening for diabetes at age 29 and every 3 years.  Cervical cancer prevention:  Pap smear starts at 49 years of age and continues periodically until 49 years old in low risk women. Pap smear every 3 years between 58 and 32 years old. Pap smear every 3-5 years between women 10 and older if pap smear negative and HPV screening negative.   -Breast cancer: Mammogram: There is disagreement between experts about when to start screening in low risk asymptomatic female but recent recommendations are to start screening at 80 and not later than 49 years old , every 1-2 years and after 49 yo q 2 years. Screening is recommended until 49 years old but some women can continue screening depending of healthy issues.   Colon cancer screening: starts at 49 years old until 49 years old.  Cholesterol  disorder screening at age 59 and every 3 years.  Also recommended:  1. Dental visit- Brush and floss your teeth twice daily; visit your dentist twice a year. 2. Eye doctor- Get an eye exam at least every 2 years. 3. Helmet use- Always wear a helmet when riding a bicycle, motorcycle, rollerblading or skateboarding. 4. Safe sex- If you may be exposed to sexually transmitted infections, use a condom. 5. Seat belts- Seat belts can save your live; always wear one. 6. Smoke/Carbon Monoxide detectors- These detectors need to be installed on the appropriate level of your home. Replace batteries at least once a year. 7. Skin cancer- When out in the sun please cover up and use sunscreen 15 SPF or higher. 8. Violence- If anyone is threatening or hurting you, please tell your healthcare provider.  9. Drink alcohol in moderation- Limit alcohol intake to one drink or less per day. Never drink and drive.  Please be sure medication list is accurate. If a new problem present, please set up appointment sooner than planned today.

## 2018-10-07 ENCOUNTER — Encounter: Payer: Self-pay | Admitting: Family Medicine

## 2018-10-07 ENCOUNTER — Ambulatory Visit (INDEPENDENT_AMBULATORY_CARE_PROVIDER_SITE_OTHER): Payer: BLUE CROSS/BLUE SHIELD | Admitting: Family Medicine

## 2018-10-07 VITALS — BP 102/76 | HR 94 | Temp 98.1°F | Resp 12 | Ht 62.0 in | Wt 173.4 lb

## 2018-10-07 DIAGNOSIS — Z1329 Encounter for screening for other suspected endocrine disorder: Secondary | ICD-10-CM

## 2018-10-07 DIAGNOSIS — D509 Iron deficiency anemia, unspecified: Secondary | ICD-10-CM

## 2018-10-07 DIAGNOSIS — D869 Sarcoidosis, unspecified: Secondary | ICD-10-CM | POA: Insufficient documentation

## 2018-10-07 DIAGNOSIS — Z130 Encounter for screening for diseases of the blood and blood-forming organs and certain disorders involving the immune mechanism: Secondary | ICD-10-CM

## 2018-10-07 DIAGNOSIS — E785 Hyperlipidemia, unspecified: Secondary | ICD-10-CM | POA: Diagnosis not present

## 2018-10-07 DIAGNOSIS — Z0000 Encounter for general adult medical examination without abnormal findings: Secondary | ICD-10-CM

## 2018-10-07 DIAGNOSIS — Z13228 Encounter for screening for other metabolic disorders: Secondary | ICD-10-CM

## 2018-10-07 LAB — COMPREHENSIVE METABOLIC PANEL
ALBUMIN: 3.9 g/dL (ref 3.5–5.2)
ALT: 5 U/L (ref 0–35)
AST: 13 U/L (ref 0–37)
Alkaline Phosphatase: 91 U/L (ref 39–117)
BUN: 11 mg/dL (ref 6–23)
CHLORIDE: 103 meq/L (ref 96–112)
CO2: 28 meq/L (ref 19–32)
CREATININE: 0.71 mg/dL (ref 0.40–1.20)
Calcium: 9.1 mg/dL (ref 8.4–10.5)
GFR: 112.29 mL/min (ref >60.00)
GLUCOSE: 96 mg/dL (ref 70–99)
POTASSIUM: 3.9 meq/L (ref 3.5–5.1)
SODIUM: 139 meq/L (ref 135–145)
Total Bilirubin: 0.5 mg/dL (ref 0.2–1.2)
Total Protein: 6.9 g/dL (ref 6.0–8.3)

## 2018-10-07 LAB — CBC
HEMATOCRIT: 39.5 % (ref 36.0–46.0)
HEMOGLOBIN: 13.3 g/dL (ref 12.0–15.0)
MCHC: 33.8 g/dL (ref 30.0–36.0)
MCV: 87.6 fl (ref 78.0–100.0)
Platelets: 213 10*3/uL (ref 150.0–400.0)
RBC: 4.5 Mil/uL (ref 3.87–5.11)
RDW: 13.4 % (ref 11.5–15.5)
WBC: 3.7 10*3/uL — ABNORMAL LOW (ref 4.0–10.5)

## 2018-10-07 LAB — LIPID PANEL
CHOLESTEROL: 214 mg/dL — AB (ref 0–200)
HDL: 48.4 mg/dL (ref >39.00)
LDL Cholesterol: 145 mg/dL — ABNORMAL HIGH (ref 0–99)
NONHDL: 165.69
Total CHOL/HDL Ratio: 4
Triglycerides: 102 mg/dL (ref 0.0–149.0)
VLDL: 20.4 mg/dL (ref 0.0–40.0)

## 2018-10-07 LAB — TSH: TSH: 1.04 u[IU]/mL (ref 0.35–4.50)

## 2018-10-07 NOTE — Assessment & Plan Note (Signed)
Asymptomatic. She is not a longer following with pulmonologist.

## 2018-10-07 NOTE — Assessment & Plan Note (Signed)
For now she will continue on nonpharmacologic treatment. Further recommendation will be given according to lipid panel results as well as 10 years CVD score.

## 2018-10-07 NOTE — Progress Notes (Signed)
HPI:   [name] is a 48 y.o. female, who is here today for her routine physical.  Last CPE: 10/02/17  Regular exercise 3 or more time per week: Not consistently. Following a healthy diet: Yes She lives with her husband and 2 children.  Chronic medical problems: Iron deficiency anemia, sarcoidosis, hyperlipidemia, GERD. Sarcoidosis is not longer active.She denies cough,wheezing,dyspena,or skin rash.  Recovering from a URI,symptoms are improving. Still having nasal congestion and rhinorrhea. Denies fever,chills,or body aches.   Pap smear: She follows with gynecologist, Dr. Dellis Filbert.  S/p hysterectomy in 04/2017.  There is no immunization history on file for this patient.  Mammogram: Planning on scheduling,she received letter. Colonoscopy: She is reporting colonoscopy done in 08/2014, no polyps, diverticulosis.  It was done due to iron deficiency anemia, 10-year follow-up was recommended.  Hyperlipidemia: Currently on non pharmacologic treatment. Following a low fat diet: Yes.  Lab Results  Component Value Date   CHOL 201 (H) 10/02/2017   HDL 49.10 10/02/2017   LDLCALC 129 (H) 10/02/2017   TRIG 114.0 10/02/2017   CHOLHDL 4 10/02/2017    Review of Systems  Constitutional: Negative for appetite change, fatigue and fever.  HENT: Positive for congestion, postnasal drip and rhinorrhea. Negative for dental problem, hearing loss, mouth sores, sore throat, trouble swallowing and voice change.   Eyes: Negative for redness and visual disturbance.  Respiratory: Negative for cough, shortness of breath and wheezing.   Cardiovascular: Negative for chest pain and leg swelling.  Gastrointestinal: Negative for abdominal pain, nausea and vomiting.       No changes in bowel habits.  Endocrine: Negative for cold intolerance, heat intolerance, polydipsia, polyphagia and polyuria.  Genitourinary: Negative for decreased urine volume, dysuria, hematuria, vaginal bleeding and  vaginal discharge.  Musculoskeletal: Negative for gait problem and myalgias.  Skin: Negative for color change and rash.  Allergic/Immunologic: Positive for environmental allergies.  Neurological: Negative for syncope, weakness and headaches.  Hematological: Negative for adenopathy. Does not bruise/bleed easily.  Psychiatric/Behavioral: Negative for confusion and sleep disturbance. The patient is not nervous/anxious.   All other systems reviewed and are negative.     Current Outpatient Medications on File Prior to Visit  Medication Sig Dispense Refill  . Multiple Vitamins-Minerals (MULTIPLE VITAMINS/WOMENS PO) Take 2 tablets by mouth daily. Gummy Vitamins    . vitamin C (ASCORBIC ACID) 500 MG tablet Take 500 mg by mouth daily.     No current facility-administered medications on file prior to visit.      Past Medical History:  Diagnosis Date  . Anemia   . Bumps on skin    left lower abdomen  . Chicken pox   . GERD (gastroesophageal reflux disease)   . Headache    Migraines  . Irregular heart beat   . Sarcoidosis     Past Surgical History:  Procedure Laterality Date  . COLONOSCOPY    . HYSTERECTOMY ABDOMINAL WITH SALPINGECTOMY Bilateral 05/15/2017   Procedure: HYSTERECTOMY ABDOMINAL WITH SALPINGECTOMY;  Surgeon: Princess Bruins, MD;  Location: [facility];  Service: Gynecology;  Laterality: Bilateral;  request to follow around 11:30am  requests 1 1/2 hours for the case.  . TUBAL LIGATION      No Known Allergies  Family History  Problem Relation Age of Onset  . Alcohol abuse Father   . Hypertension Maternal Grandmother   . Diabetes Maternal Grandmother   . Hypertension Maternal Grandfather   . Diabetes Sister   . Cancer Neg Hx  colon or gyn    Social History   Socioeconomic History  . Marital status: Married    Spouse name: Not on file  . Number of children: Not on file  . Years of education: Not on file  . Highest education level: Not on file    Occupational History  . Not on file  Social Needs  . Financial resource strain: Not on file  . Food insecurity:    Worry: Not on file    Inability: Not on file  . Transportation needs:    Medical: Not on file    Non-medical: Not on file  Tobacco Use  . Smoking status: Never Smoker  . Smokeless tobacco: Never Used  Substance and Sexual Activity  . Alcohol use: Yes    Comment: OCC  . Drug use: No  . Sexual activity: Yes    Partners: Male  Lifestyle  . Physical activity:    Days per week: Not on file    Minutes per session: Not on file  . Stress: Not on file  Relationships  . Social connections:    Talks on phone: Not on file    Gets together: Not on file    Attends religious service: Not on file    Active member of club or organization: Not on file    Attends meetings of clubs or organizations: Not on file    Relationship status: Not on file  Other Topics Concern  . Not on file  Social History Narrative  . Not on file     Vitals:   10/07/18 0859  BP: 102/76  Pulse: 94  Resp: 12  Temp: 98.1 F (36.7 C)   Body mass index is 31.72 kg/m.   Wt Readings from Last 3 Encounters:  10/07/18 173 lb 6.4 oz (78.7 kg)  10/02/17 166 lb 6 oz (75.5 kg)  05/15/17 166 lb 6 oz (75.5 kg)    Physical Exam  Nursing note and vitals reviewed. Constitutional: She is oriented to person, place, and time. She appears well-developed. No distress.  HENT:  Head: Normocephalic and atraumatic.  Right Ear: Hearing, tympanic membrane, external ear and ear canal normal.  Left Ear: Hearing, tympanic membrane, external ear and ear canal normal.  Nose: Rhinorrhea present. Right sinus exhibits no maxillary sinus tenderness and no frontal sinus tenderness. Left sinus exhibits no maxillary sinus tenderness and no frontal sinus tenderness.  Mouth/Throat: Uvula is midline, oropharynx is clear and moist and mucous membranes are normal.  Eyes: Pupils are equal, round, and reactive to light.  Conjunctivae and EOM are normal.  Neck: No tracheal deviation present. No thyromegaly present.  Cardiovascular: Normal rate and regular rhythm.  No murmur heard. Pulses:      Dorsalis pedis pulses are 2+ on the right side and 2+ on the left side.  Respiratory: Effort normal and breath sounds normal. No respiratory distress.  GI: Soft. She exhibits no mass. There is no hepatomegaly. There is no abdominal tenderness.  Genitourinary:    Genitourinary Comments: Deferred to gyn.   Musculoskeletal:        General: No edema.     Comments: No major deformity or signs of synovitis appreciated.  Lymphadenopathy:    She has no cervical adenopathy.       Right: No supraclavicular adenopathy present.       Left: No supraclavicular adenopathy present.  Neurological: She is alert and oriented to person, place, and time. She has normal strength. No cranial nerve deficit. Coordination and  gait normal.  Reflex Scores:      Bicep reflexes are 2+ on the right side and 2+ on the left side.      Patellar reflexes are 2+ on the right side and 2+ on the left side. Skin: Skin is warm. No rash noted. No erythema.  Psychiatric: She has a normal mood and affect. Cognition and memory are normal.  Well groomed, good eye contact.      ASSESSMENT AND PLAN:  Ms. Seferina Brokaw was here today annual physical examination.   Orders Placed This Encounter  Procedures  . Comprehensive metabolic panel  . CBC  . Lipid panel  . TSH   Lab Results  Component Value Date   TSH 1.04 10/07/2018   Lab Results  Component Value Date   CHOL 214 (H) 10/07/2018   HDL 48.40 10/07/2018   LDLCALC 145 (H) 10/07/2018   TRIG 102.0 10/07/2018   CHOLHDL 4 10/07/2018   Lab Results  Component Value Date   CREATININE 0.71 10/07/2018   BUN 11 10/07/2018   NA 139 10/07/2018   K 3.9 10/07/2018   CL 103 10/07/2018   CO2 28 10/07/2018   Lab Results  Component Value Date   ALT 5 10/07/2018   AST 13 10/07/2018    ALKPHOS 91 10/07/2018   BILITOT 0.5 10/07/2018    Lab Results  Component Value Date   WBC 3.7 (L) 10/07/2018   HGB 13.3 10/07/2018   HCT 39.5 10/07/2018   MCV 87.6 10/07/2018   PLT 213.0 10/07/2018    Routine general medical examination at a health care facility We discussed the importance of regular physical activity and healthy diet for prevention of chronic illness and/or complications. Preventive guidelines reviewed. Vaccination up to date,refused flu vaccine. Ca++ and vit D supplementation discussed current recommendations, Next CPE in a year.  The 10-year ASCVD risk score Mikey Bussing DC Brooke Bonito., et al., 2013) is: 0.9%   Values used to calculate the score:     Age: 38 years     Sex: Female     Is Non-Hispanic African American: Yes     Diabetic: No     Tobacco smoker: No     Systolic Blood Pressure: 673 mmHg     Is BP treated: No     HDL Cholesterol: 48.4 mg/dL     Total Cholesterol: 214 mg/dL  Screening for endocrine, metabolic and immunity disorder -     Comprehensive metabolic panel  Anemia, iron deficiency Improved after hysterectomy. Further recommendation will be given according to CBC results.  Hyperlipidemia For now she will continue on nonpharmacologic treatment. Further recommendation will be given according to lipid panel results as well as 10 years CVD score.  Sarcoidosis Asymptomatic. She is not a longer following with pulmonologist.     Return in 1 year (on 10/08/2019) for cpe.    Alvino Lechuga G. Martinique, MD  [facility]. [place] office.

## 2018-10-07 NOTE — Patient Instructions (Addendum)
A few things to remember from today's visit:   Hyperlipidemia, unspecified hyperlipidemia type  Today you have you routine preventive visit.  At least 150 minutes of moderate exercise per week, daily brisk walking for 15-30 min is a good exercise option. Healthy diet low in saturated (animal) fats and sweets and consisting of fresh fruits and vegetables, lean meats such as fish and white chicken and whole grains.  These are some of recommendations for screening depending of age and risk factors:   - Vaccines:  Tdap vaccine every 10 years.  Shingles vaccine recommended at age 69, could be given after 50 years of age but not sure about insurance coverage.   Pneumonia vaccines:  Prevnar 13 at 65 and Pneumovax at 48. Sometimes Pneumovax is giving earlier if history of smoking, lung disease,diabetes,kidney disease among some.    Screening for diabetes at age 22 and every 3 years.  Cervical cancer prevention:  S/P hyterectomy   -Breast cancer: Mammogram: There is disagreement between experts about when to start screening in low risk asymptomatic female but recent recommendations are to start screening at 74 and not later than 50 years old , every 1-2 years and after 50 yo q 2 years. Screening is recommended until 50 years old but some women can continue screening depending of healthy issues.   Colon cancer screening: starts at 50 years old until 50 years old. 2025  Cholesterol disorder screening at age 39 and every 3 years.  Also recommended:  1. Dental visit- Brush and floss your teeth twice daily; visit your dentist twice a year. 2. Eye doctor- Get an eye exam at least every 2 years. 3. Helmet use- Always wear a helmet when riding a bicycle, motorcycle, rollerblading or skateboarding. 4. Safe sex- If you may be exposed to sexually transmitted infections, use a condom. 5. Seat belts- Seat belts can save your live; always wear one. 6. Smoke/Carbon Monoxide detectors- These  detectors need to be installed on the appropriate level of your home. Replace batteries at least once a year. 7. Skin cancer- When out in the sun please cover up and use sunscreen 15 SPF or higher. 8. Violence- If anyone is threatening or hurting you, please tell your healthcare provider.  9. Drink alcohol in moderation- Limit alcohol intake to one drink or less per day. Never drink and drive.  Please be sure medication list is accurate. If a new problem present, please set up appointment sooner than planned today.

## 2018-10-07 NOTE — Assessment & Plan Note (Signed)
Improved after hysterectomy. Further recommendation will be given according to CBC results.

## 2019-06-27 ENCOUNTER — Telehealth: Payer: Self-pay | Admitting: Family Medicine

## 2019-06-27 NOTE — Telephone Encounter (Signed)
Left message for patient to return call to office to schedule office visit, can be virtual or in office to discuss need for medication due to medication not being on medication list.

## 2019-06-27 NOTE — Telephone Encounter (Signed)
Patient requesting omeprazole (PRILOSEC) 20 MG capsule but not on current medication list.

## 2019-06-27 NOTE — Telephone Encounter (Signed)
Pt request refill   omeprazole (PRILOSEC) 20 MG capsule  [id number] - [place], [place] - [address] AT [phone] (Phone) [phone] (Fax)   Pt states she has not had this med in a while, but knows she is having heartburn and does not need appt.

## 2019-06-27 NOTE — Telephone Encounter (Signed)
Patient is  requesting medication not on current list.

## 2019-08-13 ENCOUNTER — Other Ambulatory Visit: Payer: Self-pay

## 2019-08-13 ENCOUNTER — Ambulatory Visit (INDEPENDENT_AMBULATORY_CARE_PROVIDER_SITE_OTHER): Payer: BC Managed Care – PPO | Admitting: Family Medicine

## 2019-08-13 ENCOUNTER — Encounter: Payer: Self-pay | Admitting: Family Medicine

## 2019-08-13 VITALS — BP 120/80 | HR 98 | Temp 97.5°F | Resp 16 | Ht 62.0 in | Wt 172.2 lb

## 2019-08-13 DIAGNOSIS — M25562 Pain in left knee: Secondary | ICD-10-CM

## 2019-08-13 DIAGNOSIS — R1013 Epigastric pain: Secondary | ICD-10-CM

## 2019-08-13 DIAGNOSIS — R1010 Upper abdominal pain, unspecified: Secondary | ICD-10-CM

## 2019-08-13 DIAGNOSIS — K5900 Constipation, unspecified: Secondary | ICD-10-CM | POA: Diagnosis not present

## 2019-08-13 MED ORDER — OMEPRAZOLE 40 MG PO CPDR: 40.0000 mg | DELAYED_RELEASE_CAPSULE | Freq: Every day | ORAL | 3 refills | Status: DC

## 2019-08-13 NOTE — Patient Instructions (Addendum)
A few things to remember from today's visit:   Dyspepsia - Plan: omeprazole (PRILOSEC) 40 MG capsule  Constipation, unspecified constipation type  Left knee pain, unspecified chronicity   GERD:  Avoid foods that make your symptoms worse, for example coffee, chocolate,pepermeint,alcohol, and greasy food. Raising the head of your bed about 6 inches may help with nocturnal symptoms.  Avoid tobacco use. Weight loss (if you are overweight). Avoid lying down for 3 hours after eating.  Instead 3 large meals daily try small and more frequent meals during the day.  You should be evaluated immediately if bloody vomiting, bloody stools, black stools (like tar), difficulty swallowing, food gets stuck on the way down or choking when eating. Abnormal weight loss or severe abdominal pain.  If symptoms are not resolved sometimes endoscopy is necessary.  Please be sure medication list is accurate. If a new problem present, please set up appointment sooner than planned today.

## 2019-08-13 NOTE — Progress Notes (Signed)
ACUTE VISIT   HPI:  Chief Complaint  Patient presents with  . Abdominal Pain    Cheyenne Walters is a 48 y.o. female, who is here today complaining of upper abdominal pain, "like gas", and sometimes lower mid chest pain. Pain is intermittent 5/10,no radiated. Alleviated by burping or passing gas. Exacerbated by food intake, . She has not noted heartburn. OTC Tums, helps temporarily.  Yesterday she had a hard stool. For the past 2 months she has been on apple cider vinegar and tumeric, she thinks this may be causing constipation. Denies associated nausea,vomiting,blood in stool, or melena.  Negative for fever,chills,chnages in appetite, or urinary symptoms.  Left knee pain for about 3 days. No hx of trauma. She is training for a different position, that has caused left elbow pain , not sure oif knee has also been affected.  No edema or erythema. "Sore", mild. She has not taken OTC medication.  Review of Systems  Constitutional: Negative for activity change, fatigue and unexpected weight change.  HENT: Negative for mouth sores, sore throat and trouble swallowing.   Respiratory: Negative for cough, shortness of breath and wheezing.   Cardiovascular: Negative for chest pain, palpitations and leg swelling.  Genitourinary: Negative for decreased urine volume, dysuria and hematuria.  Musculoskeletal: Negative for gait problem and myalgias.  Skin: Negative for pallor and rash.  Neurological: Negative for syncope and weakness.  Rest see pertinent positives and negatives per HPI.   Current Outpatient Medications on File Prior to Visit  Medication Sig Dispense Refill  . Multiple Vitamins-Minerals (MULTIPLE VITAMINS/WOMENS PO) Take 2 tablets by mouth daily. Gummy Vitamins    . vitamin C (ASCORBIC ACID) 500 MG tablet Take 500 mg by mouth daily.     No current facility-administered medications on file prior to visit.      Past Medical History:  Diagnosis Date  .  Anemia   . Bumps on skin    left lower abdomen  . Chicken pox   . GERD (gastroesophageal reflux disease)   . Headache    Migraines  . Irregular heart beat   . Sarcoidosis    No Known Allergies  Social History   Socioeconomic History  . Marital status: Married    Spouse name: Not on file  . Number of children: Not on file  . Years of education: Not on file  . Highest education level: Not on file  Occupational History  . Not on file  Social Needs  . Financial resource strain: Not on file  . Food insecurity    Worry: Not on file    Inability: Not on file  . Transportation needs    Medical: Not on file    Non-medical: Not on file  Tobacco Use  . Smoking status: Never Smoker  . Smokeless tobacco: Never Used  Substance and Sexual Activity  . Alcohol use: Yes    Comment: OCC  . Drug use: No  . Sexual activity: Yes    Partners: Male  Lifestyle  . Physical activity    Days per week: Not on file    Minutes per session: Not on file  . Stress: Not on file  Relationships  . Social Herbalist on phone: Not on file    Gets together: Not on file    Attends religious service: Not on file    Active member of club or organization: Not on file    Attends meetings of clubs or  organizations: Not on file    Relationship status: Not on file  Other Topics Concern  . Not on file  Social History Narrative  . Not on file    Vitals:   08/13/19 0814  BP: 120/80  Pulse: 98  Resp: 16  Temp: (!) 97.5 F (36.4 C)  SpO2: 97%   Body mass index is 31.5 kg/m.  Physical Exam  Nursing note and vitals reviewed. Constitutional: She is oriented to person, place, and time. She appears well-developed. She does not appear ill. No distress.  HENT:  Head: Atraumatic.  Mouth/Throat: Oropharynx is clear and moist and mucous membranes are normal.  Eyes: Conjunctivae are normal.  Cardiovascular: Normal rate and regular rhythm.  No murmur heard. Respiratory: Effort normal and  breath sounds normal. No respiratory distress.  GI: Soft. Bowel sounds are normal. She exhibits no distension and no mass. There is no hepatomegaly. There is abdominal tenderness in the epigastric area. There is no rigidity and no guarding.  Musculoskeletal:        General: No edema.     Left knee: She exhibits normal range of motion, no effusion and no erythema. No tenderness found.  Lymphadenopathy:    She has no cervical adenopathy.  Neurological: She is alert and oriented to person, place, and time. She has normal strength. Gait normal.  Skin: Skin is warm. No rash noted. No erythema.  Psychiatric: She has a normal mood and affect.  Well groomed, good eye contact.   ASSESSMENT AND PLAN:  Cheyenne Walters was seen today for abdominal pain.  Diagnoses and all orders for this visit:  Upper abdominal pain Possible etiologies discussed. Combination between constipation and GERD/dyspeptia most likely.  I do not think imaging or further work-up is needed at this time. Instructed about warning signs.  Dyspepsia GERD precautions recommended. Agrees with trying Omeprazole 40 mg 30 min before breakfast.  -     omeprazole (PRILOSEC) 40 MG capsule; Take 1 capsule (40 mg total) by mouth daily.  Constipation, unspecified constipation type Adequate fiber and fluid intake. Benafiber 1 tsp bis may help. OTC Miralax if needed. Instructed about warning signs.  Left knee pain, unspecified chronicity ? OA. Avoid activities that aggravate pain. OTC Tylenol 500 mg 3-4 times per day if needed. For now I do not think imaging is needed.    Return in about 2 months (around 10/13/2019).    Boluwatife Flight G. Martinique, MD  [facility]. [place] office.

## 2019-10-18 DIAGNOSIS — Z20822 Contact with and (suspected) exposure to covid-19: Secondary | ICD-10-CM | POA: Diagnosis not present

## 2019-10-18 DIAGNOSIS — Z03818 Encounter for observation for suspected exposure to other biological agents ruled out: Secondary | ICD-10-CM | POA: Diagnosis not present

## 2019-10-27 ENCOUNTER — Telehealth: Payer: Self-pay | Admitting: Family Medicine

## 2019-10-27 NOTE — Telephone Encounter (Signed)
I assumed she was given recommendations in regard to disease and treatment. She may need a note for work. Please arrange appt if further questions.  Thanks, BJ

## 2019-10-27 NOTE — Telephone Encounter (Signed)
Had COVID test Thursday and came back Saturday positive.  Please Advise

## 2019-10-28 NOTE — Telephone Encounter (Signed)
I called and spoke with pt. She is feeling ok, only really having headaches for now. She declined a virtual visit for now - but will call back if her symptoms get worse. She is unsure if she'll need a note for work - but will let us know.

## 2019-11-03 ENCOUNTER — Telehealth: Payer: Self-pay | Admitting: Family Medicine

## 2019-11-03 NOTE — Telephone Encounter (Signed)
Letter completed, message sent to pt explaining how to access letter.

## 2019-11-03 NOTE — Telephone Encounter (Signed)
Pt tested positive on 10/25/19, okay for note?

## 2019-11-03 NOTE — Telephone Encounter (Signed)
Letter can be given as requested. Thanks, BJ

## 2019-11-03 NOTE — Telephone Encounter (Signed)
Pt is requesting a note for her employer indicating that she can return on the 15th from [place]. She said she is now feeling the symptoms of Covid and feels that hopefully in a week she would have her energy back. She would like it to be sent via mychart if possible.

## 2019-12-19 ENCOUNTER — Telehealth: Payer: Self-pay | Admitting: Family Medicine

## 2019-12-19 NOTE — Telephone Encounter (Signed)
Pt was tested positive for COVID on January the 30th. She is wondering how long she needs to wait until she can get the covid vaccine? She is also wondering if we can send a referral for a mammogram cause she is due for one.   Pt can be reached at [phone] -ok to leave detailed message per pt.

## 2019-12-22 NOTE — Telephone Encounter (Signed)
Left a message for a return call.

## 2019-12-22 NOTE — Telephone Encounter (Signed)
She does not need referral for mammogram if her last one was done here in town, otherwise referral can be placed.  She can get her COVID-19 vaccination as soon as 2 to 3 weeks after recovering from COVID-19 infection if she did not receive IV monoclonal antibody infusion.  I usually recommend to wait 2-3 months.  Thanks, BJ

## 2019-12-30 ENCOUNTER — Encounter: Payer: Self-pay | Admitting: *Deleted

## 2019-12-30 NOTE — Telephone Encounter (Signed)
Patient notified

## 2020-02-06 ENCOUNTER — Encounter: Payer: Self-pay | Admitting: Family Medicine

## 2020-02-06 ENCOUNTER — Telehealth (INDEPENDENT_AMBULATORY_CARE_PROVIDER_SITE_OTHER): Payer: BC Managed Care – PPO | Admitting: Family Medicine

## 2020-02-06 VITALS — Ht 62.0 in

## 2020-02-06 DIAGNOSIS — S060X9D Concussion with loss of consciousness of unspecified duration, subsequent encounter: Secondary | ICD-10-CM | POA: Diagnosis not present

## 2020-02-06 DIAGNOSIS — G44319 Acute post-traumatic headache, not intractable: Secondary | ICD-10-CM

## 2020-02-06 NOTE — Progress Notes (Signed)
Virtual Visit via Video Note   I connected with Cheyenne Torello on 02/06/20 by a video enabled telemedicine application and verified that I am speaking with the correct person using two identifiers.  Location patient: home Location provider:work office Persons participating in the virtual visit: patient, provider  I discussed the limitations of evaluation and management by telemedicine and the availability of in person appointments. The patient expressed understanding and agreed to proceed.   HPI: Cheyenne. Cheyenne Walters is a 49 year old female with history of hypertension, hyperlipidemia, sarcoidosis, and migraines who is concerned about persistent headache after head trauma that occurred at work.  On 01/28/20 while she was at work , around 5:30 Pm she was leaning down to pick up something up on the bottom shelf, when she stood up she hit the top of her head on a shelf above it. She found herself on the floor but she does not remember falling,fall was recorded in video camera. She was on the floor for 1-2 min, ? LOC. According to the patient, she does not remember things that she did right after event and reported to her by co-workers. Nurse at work did a rapid neuro exam , she was oriented x 3.  on her way home she had to call somebody to talk while driving, afraid of falling asleep. Negative for urine or bowel incontinence or seizure like activity.  Evaluated in urgent care on 01/30/20. Headache is constant, achy/pressure headache temporal, 2-3/10. Stable. She is not sure about exacerbating or alleviating factors. She was told to take Tylenol PM Still feeling drowsy/sleepy, she is not going to work today.  She works third shift and have not had trouble with sleep.  Associated nausea until yesterday.  She also had some left eye blurry vision, which is better.  Left upper and lower extremity achy pain, which she attributes to fall, she landed on left side. Negative for fever, chills, vomiting, or focal  deficit. She has no wound on are of trauma.  ROS: See pertinent positives and negatives per HPI.  Past Medical History:  Diagnosis Date  . Anemia   . Bumps on skin    left lower abdomen  . Chicken pox   . GERD (gastroesophageal reflux disease)   . Headache    Migraines  . Irregular heart beat   . Sarcoidosis     Past Surgical History:  Procedure Laterality Date  . COLONOSCOPY    . HYSTERECTOMY ABDOMINAL WITH SALPINGECTOMY Bilateral 05/15/2017   Procedure: HYSTERECTOMY ABDOMINAL WITH SALPINGECTOMY;  Surgeon: Princess Bruins, MD;  Location: [facility];  Service: Gynecology;  Laterality: Bilateral;  request to follow around 11:30am  requests 1 1/2 hours for the case.  . TUBAL LIGATION      Family History  Problem Relation Age of Onset  . Alcohol abuse Father   . Hypertension Maternal Grandmother   . Diabetes Maternal Grandmother   . Hypertension Maternal Grandfather   . Diabetes Sister   . Cancer Neg Hx        colon or gyn    Social History   Socioeconomic History  . Marital status: Married    Spouse name: Not on file  . Number of children: Not on file  . Years of education: Not on file  . Highest education level: Not on file  Occupational History  . Not on file  Tobacco Use  . Smoking status: Never Smoker  . Smokeless tobacco: Never Used  Substance and Sexual Activity  . Alcohol use: Yes  Comment: OCC  . Drug use: No  . Sexual activity: Yes    Partners: Male  Other Topics Concern  . Not on file  Social History Narrative  . Not on file   Social Determinants of Health   Financial Resource Strain:   . Difficulty of Paying Living Expenses:   Food Insecurity:   . Worried About Charity fundraiser in the Last Year:   . Arboriculturist in the Last Year:   Transportation Needs:   . Film/video editor (Medical):   Marland Kitchen Lack of Transportation (Non-Medical):   Physical Activity:   . Days of Exercise per Week:   . Minutes of Exercise per Session:    Stress:   . Feeling of Stress :   Social Connections:   . Frequency of Communication with Friends and Family:   . Frequency of Social Gatherings with Friends and Family:   . Attends Religious Services:   . Active Member of Clubs or Organizations:   . Attends Archivist Meetings:   Marland Kitchen Marital Status:   Intimate Partner Violence:   . Fear of Current or Ex-Partner:   . Emotionally Abused:   Marland Kitchen Physically Abused:   . Sexually Abused:     Current Outpatient Medications:  Marland Kitchen  Multiple Vitamins-Minerals (MULTIPLE VITAMINS/WOMENS PO), Take 2 tablets by mouth daily. Gummy Vitamins, Disp: , Rfl:  .  vitamin C (ASCORBIC ACID) 500 MG tablet, Take 500 mg by mouth daily., Disp: , Rfl:  .  omeprazole (PRILOSEC) 40 MG capsule, Take 1 capsule (40 mg total) by mouth daily. (Patient not taking: Reported on 02/06/2020), Disp: 30 capsule, Rfl: 3  EXAM:  VITALS per patient if applicable:Ht 5\' 2"  (1.575 m)   LMP 04/30/2017 (Exact Date)   BMI 31.50 kg/m   GENERAL: alert, oriented, appears well and in no acute distress  HEENT: atraumatic, conjunctiva clear, no obvious abnormalities on inspection.  NECK: normal movements of the head and neck  LUNGS: on inspection no signs of respiratory distress, breathing rate appears normal, no obvious gross SOB, gasping or wheezing  CV: no obvious cyanosis  Cheyenne: moves all visible extremities without noticeable abnormality  PSYCH/NEURO: pleasant and cooperative, no obvious depression or anxiety, speech and thought processing grossly intact. I do not appreciate focal deficit.  ASSESSMENT AND PLAN:  Discussed the following assessment and plan:  Concussion with loss of consciousness, subsequent encounter We discussed Dx'ed and prognosis. Explained that it is not unusual to have some symptoms she is reporting above after head concussion. It may take a few weeks for symptoms to resolved.  Acute post-traumatic headache, not intractable - Plan: CT HEAD W &  WO CONTRAST Headache is stable. Concerned about headache. Not clear about LOC+perioid of amnesia. Head CT will be arranged. Note for work. Instructed about warning signs.  I discussed the assessment and treatment plan with the patient. Cheyenne Walters was provided an opportunity to ask questions and all were answered. She agreed with the plan and demonstrated an understanding of the instructions.  Return if symptoms worsen or fail to improve.   Anette Barra Martinique, MD

## 2020-02-12 ENCOUNTER — Other Ambulatory Visit: Payer: Self-pay | Admitting: Family Medicine

## 2020-02-12 ENCOUNTER — Telehealth: Payer: Self-pay | Admitting: Family Medicine

## 2020-02-12 DIAGNOSIS — Z1231 Encounter for screening mammogram for malignant neoplasm of breast: Secondary | ICD-10-CM

## 2020-02-12 NOTE — Telephone Encounter (Signed)
Hayley from [facility] stated they are needing clarification on the CT order that was sent over. They are wondering if the PCP will be okay doing a CT of her head without contrast?  She stated that they typically do contrast with Head CT scan if they have/had cancer.   Hayley can be reached at [phone]

## 2020-02-13 NOTE — Telephone Encounter (Signed)
It is ok to do head CT w/o contrast. Thanks, BJ

## 2020-02-13 NOTE — Telephone Encounter (Signed)
Please advise 

## 2020-02-16 NOTE — Telephone Encounter (Signed)
Left detailed message for Cheyenne Walters to return call to office.

## 2020-02-17 NOTE — Telephone Encounter (Signed)
Patient scheduled for 03/01/2020.

## 2020-03-01 ENCOUNTER — Ambulatory Visit
Admission: RE | Admit: 2020-03-01 | Discharge: 2020-03-01 | Disposition: A | Discharge status: Home / Self Care | Payer: BC Managed Care – PPO | Source: Ambulatory Visit | Attending: Family Medicine | Admitting: Family Medicine

## 2020-03-01 DIAGNOSIS — G44319 Acute post-traumatic headache, not intractable: Secondary | ICD-10-CM

## 2020-03-01 DIAGNOSIS — R519 Headache, unspecified: Secondary | ICD-10-CM | POA: Diagnosis not present

## 2020-03-03 ENCOUNTER — Telehealth: Payer: Self-pay | Admitting: Family Medicine

## 2020-03-03 NOTE — Telephone Encounter (Signed)
Pt is calling in to get CT results and is aware that they will give her a call once the pt has seen the report.

## 2020-03-05 NOTE — Telephone Encounter (Signed)
Please advise 

## 2020-03-05 NOTE — Telephone Encounter (Signed)
I sent her a message with results. No acute abnormalities. [Can you please ask her if she can se message,due to system updates I am not sure if pts can see messages.] Thanks, BJ

## 2020-03-08 NOTE — Telephone Encounter (Signed)
Left message for patient to return call to office. 

## 2020-03-09 ENCOUNTER — Other Ambulatory Visit: Payer: Self-pay

## 2020-03-09 ENCOUNTER — Ambulatory Visit
Admission: RE | Admit: 2020-03-09 | Discharge: 2020-03-09 | Disposition: A | Discharge status: Home / Self Care | Payer: BC Managed Care – PPO | Source: Ambulatory Visit | Attending: Family Medicine | Admitting: Family Medicine

## 2020-03-09 DIAGNOSIS — Z1231 Encounter for screening mammogram for malignant neoplasm of breast: Secondary | ICD-10-CM

## 2020-03-09 NOTE — Telephone Encounter (Signed)
Spoke with patient and she stated that she saw results on mychart. Went over results again with patient. Patient verbalized understanding.

## 2020-04-29 ENCOUNTER — Other Ambulatory Visit: Payer: Self-pay

## 2020-04-29 ENCOUNTER — Ambulatory Visit (HOSPITAL_COMMUNITY)
Admission: EM | Admit: 2020-04-29 | Discharge: 2020-04-29 | Disposition: A | Discharge status: Home / Self Care | Payer: BC Managed Care – PPO | Attending: Physician Assistant | Admitting: Physician Assistant

## 2020-04-29 DIAGNOSIS — M7918 Myalgia, other site: Secondary | ICD-10-CM

## 2020-04-29 DIAGNOSIS — S161XXA Strain of muscle, fascia and tendon at neck level, initial encounter: Secondary | ICD-10-CM

## 2020-04-29 MED ORDER — TIZANIDINE HCL 4 MG PO TABS: 4.0000 mg | ORAL_TABLET | Freq: Three times a day (TID) | ORAL | 0 refills | Status: AC | PRN

## 2020-04-29 MED ORDER — ACETAMINOPHEN 325 MG PO TABS
650.0000 mg | ORAL_TABLET | Freq: Four times a day (QID) | ORAL | 0 refills | Status: AC | PRN
Start: 2020-04-29 — End: ?
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
## 2020-04-30 ENCOUNTER — Ambulatory Visit: Payer: BC Managed Care – PPO | Admitting: Family Medicine

## 2020-05-03 ENCOUNTER — Encounter: Payer: Self-pay | Admitting: Family Medicine

## 2020-05-03 ENCOUNTER — Ambulatory Visit (INDEPENDENT_AMBULATORY_CARE_PROVIDER_SITE_OTHER): Payer: BC Managed Care – PPO | Admitting: Family Medicine

## 2020-05-03 ENCOUNTER — Other Ambulatory Visit: Payer: Self-pay

## 2020-05-03 ENCOUNTER — Ambulatory Visit (INDEPENDENT_AMBULATORY_CARE_PROVIDER_SITE_OTHER)
Admission: RE | Admit: 2020-05-03 | Discharge: 2020-05-03 | Disposition: A | Discharge status: Home / Self Care | Payer: BC Managed Care – PPO | Source: Ambulatory Visit | Attending: Family Medicine | Admitting: Family Medicine

## 2020-05-03 DIAGNOSIS — M545 Low back pain, unspecified: Secondary | ICD-10-CM

## 2020-05-03 DIAGNOSIS — M7542 Impingement syndrome of left shoulder: Secondary | ICD-10-CM

## 2020-05-03 DIAGNOSIS — M1712 Unilateral primary osteoarthritis, left knee: Secondary | ICD-10-CM | POA: Diagnosis not present

## 2020-05-03 DIAGNOSIS — M542 Cervicalgia: Secondary | ICD-10-CM | POA: Diagnosis not present

## 2020-05-03 DIAGNOSIS — M25562 Pain in left knee: Secondary | ICD-10-CM

## 2020-05-03 DIAGNOSIS — M50322 Other cervical disc degeneration at C5-C6 level: Secondary | ICD-10-CM | POA: Diagnosis not present

## 2020-05-03 DIAGNOSIS — S4992XA Unspecified injury of left shoulder and upper arm, initial encounter: Secondary | ICD-10-CM | POA: Diagnosis not present

## 2020-05-03 DIAGNOSIS — S8992XA Unspecified injury of left lower leg, initial encounter: Secondary | ICD-10-CM | POA: Diagnosis not present

## 2020-05-03 DIAGNOSIS — M25512 Pain in left shoulder: Secondary | ICD-10-CM | POA: Diagnosis not present

## 2020-05-03 MED ORDER — CELECOXIB 100 MG PO CAPS: 100.0000 mg | ORAL_CAPSULE | Freq: Two times a day (BID) | ORAL | 0 refills | Status: AC
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
## 2020-05-04 ENCOUNTER — Telehealth: Payer: Self-pay | Admitting: Family Medicine

## 2020-05-04 NOTE — Telephone Encounter (Signed)
Pt called to see if her FMLA papers have been filled out and faxed back to [employer] at [phone]  Please call Pt at [phone]

## 2020-05-04 NOTE — Telephone Encounter (Signed)
Waiting for provider to sign paperwork. Will notify pt once faxed.

## 2020-05-06 ENCOUNTER — Telehealth: Payer: Self-pay | Admitting: Family Medicine

## 2020-05-06 ENCOUNTER — Other Ambulatory Visit: Payer: Self-pay

## 2020-05-06 ENCOUNTER — Encounter: Payer: Self-pay | Admitting: Physical Therapy

## 2020-05-06 ENCOUNTER — Ambulatory Visit: Payer: BC Managed Care – PPO | Attending: Family Medicine | Admitting: Physical Therapy

## 2020-05-06 DIAGNOSIS — M6281 Muscle weakness (generalized): Secondary | ICD-10-CM

## 2020-05-06 DIAGNOSIS — M25611 Stiffness of right shoulder, not elsewhere classified: Secondary | ICD-10-CM | POA: Diagnosis not present

## 2020-05-06 DIAGNOSIS — M542 Cervicalgia: Secondary | ICD-10-CM

## 2020-05-06 DIAGNOSIS — M25612 Stiffness of left shoulder, not elsewhere classified: Secondary | ICD-10-CM

## 2020-05-06 NOTE — Patient Instructions (Signed)
Access Code: [email or web address] URL: [email or web address] Date: 05/06/2020 Prepared by: Ruben Im  Exercises Seated Cervical Rotation AROM - 4 x daily - 7 x weekly - 3 sets - 3 reps Seated Cervical Flexion AROM - 4 x daily - 7 x weekly - 3 sets - 3 reps Standing Cervical Sidebending AROM - 4 x daily - 7 x weekly - 3 sets - 3 reps Seated Scapular Retraction - 4 x daily - 7 x weekly - 3 sets - 3 reps  Patient Education Sleep Hygiene
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
## 2020-05-06 NOTE — Telephone Encounter (Signed)
Pt is calling in stating that she would like to see if she can get her return to work extended another week (05/17/2020).  Pt stated that she will be starting PT on 05/06/2020 at 2:00 and do not know what kind of pain she will be in after going through the therapy.  Pt stated that she is standing all day at work and would like to see if she can get the work note extended.  Pt stated that she is having problems standing a long period of time at home now.

## 2020-05-10 NOTE — Telephone Encounter (Signed)
Okay to extend note? 

## 2020-05-10 NOTE — Telephone Encounter (Signed)
Okay per Dr. Martinique. Forms already filled out & placed on providers desk to be signed.

## 2020-05-13 ENCOUNTER — Other Ambulatory Visit: Payer: Self-pay

## 2020-05-13 ENCOUNTER — Ambulatory Visit: Payer: BC Managed Care – PPO | Admitting: Physical Therapy

## 2020-05-13 DIAGNOSIS — M542 Cervicalgia: Secondary | ICD-10-CM

## 2020-05-13 DIAGNOSIS — M6281 Muscle weakness (generalized): Secondary | ICD-10-CM

## 2020-05-13 DIAGNOSIS — M25611 Stiffness of right shoulder, not elsewhere classified: Secondary | ICD-10-CM | POA: Diagnosis not present

## 2020-05-13 DIAGNOSIS — M25612 Stiffness of left shoulder, not elsewhere classified: Secondary | ICD-10-CM
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
## 2020-05-13 NOTE — Patient Instructions (Signed)
Access Code: [email or web address] URL: [email or web address] Date: 05/13/2020 Prepared by: Ruben Im  Exercises Seated Cervical Rotation AROM - 4 x daily - 7 x weekly - 3 sets - 3 reps Seated Cervical Flexion AROM - 4 x daily - 7 x weekly - 3 sets - 3 reps Standing Cervical Sidebending AROM - 4 x daily - 7 x weekly - 3 sets - 3 reps Seated Scapular Retraction - 4 x daily - 7 x weekly - 3 sets - 3 reps doorway hip flexor stretch - 1 x daily - 7 x weekly - 3 sets - 5 reps Median Nerve Flossing - 1 x daily - 7 x weekly - 1 sets - 10 reps Standing Row with Anchored Resistance - 1 x daily - 7 x weekly - 2 sets - 10 reps Standing Shoulder Extension with Resistance - 1 x daily - 7 x weekly - 2 sets - 10 reps  Patient Education Sleep Hygiene  TENS UNIT  This is helpful for muscle pain and spasm.   Search and Purchase a TENS 7000 2nd edition at www.tenspros.com or www.amazon.com  (It should be less than $30)     TENS unit instructions:   Do not shower or bathe with the unit on  Turn the unit off before removing electrodes or batteries  If the electrodes lose stickiness add a drop of water to the electrodes after they are disconnected from the unit and place on plastic sheet. If you continued to have difficulty, call the TENS unit company to purchase more electrodes.  Do not apply lotion on the skin area prior to use. Make sure the skin is clean and dry as this will help prolong the life of the electrodes.  After use, always check skin for unusual red areas, rash or other skin difficulties. If there are any skin problems, does not apply electrodes to the same area.  Never remove the electrodes from the unit by pulling the wires.  Do not use the TENS unit or electrodes other than as directed.  Do not change electrode placement without consulting your therapist or physician.  Keep 2 fingers with between each electrode.    Ruben Im PT [facility] [address], [place], [place] [address] Phone # [phone] Fax [phone]

## 2020-05-14 ENCOUNTER — Encounter: Payer: Self-pay | Admitting: Family Medicine

## 2020-05-17 ENCOUNTER — Ambulatory Visit: Payer: BC Managed Care – PPO

## 2020-05-17 ENCOUNTER — Other Ambulatory Visit: Payer: Self-pay

## 2020-05-17 DIAGNOSIS — M25612 Stiffness of left shoulder, not elsewhere classified: Secondary | ICD-10-CM

## 2020-05-17 DIAGNOSIS — M542 Cervicalgia: Secondary | ICD-10-CM

## 2020-05-17 DIAGNOSIS — M6281 Muscle weakness (generalized): Secondary | ICD-10-CM | POA: Diagnosis not present

## 2020-05-17 DIAGNOSIS — M25611 Stiffness of right shoulder, not elsewhere classified: Secondary | ICD-10-CM | POA: Diagnosis not present
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
## 2020-05-24 ENCOUNTER — Ambulatory Visit: Payer: BC Managed Care – PPO

## 2020-05-24 ENCOUNTER — Other Ambulatory Visit: Payer: Self-pay

## 2020-05-24 DIAGNOSIS — M6281 Muscle weakness (generalized): Secondary | ICD-10-CM | POA: Diagnosis not present

## 2020-05-24 DIAGNOSIS — M25611 Stiffness of right shoulder, not elsewhere classified: Secondary | ICD-10-CM | POA: Diagnosis not present

## 2020-05-24 DIAGNOSIS — M25612 Stiffness of left shoulder, not elsewhere classified: Secondary | ICD-10-CM

## 2020-05-24 DIAGNOSIS — M542 Cervicalgia: Secondary | ICD-10-CM | POA: Diagnosis not present
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
## 2020-06-02 ENCOUNTER — Other Ambulatory Visit: Payer: Self-pay

## 2020-06-02 ENCOUNTER — Ambulatory Visit: Payer: BC Managed Care – PPO | Attending: Family Medicine

## 2020-06-02 DIAGNOSIS — M25611 Stiffness of right shoulder, not elsewhere classified: Secondary | ICD-10-CM | POA: Diagnosis not present

## 2020-06-02 DIAGNOSIS — M6281 Muscle weakness (generalized): Secondary | ICD-10-CM | POA: Diagnosis not present

## 2020-06-02 DIAGNOSIS — M542 Cervicalgia: Secondary | ICD-10-CM | POA: Diagnosis not present

## 2020-06-02 DIAGNOSIS — M25612 Stiffness of left shoulder, not elsewhere classified: Secondary | ICD-10-CM
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
## 2020-06-09 ENCOUNTER — Ambulatory Visit: Payer: BC Managed Care – PPO

## 2020-06-09 ENCOUNTER — Other Ambulatory Visit: Payer: Self-pay

## 2020-06-09 DIAGNOSIS — M25612 Stiffness of left shoulder, not elsewhere classified: Secondary | ICD-10-CM | POA: Diagnosis not present

## 2020-06-09 DIAGNOSIS — M6281 Muscle weakness (generalized): Secondary | ICD-10-CM

## 2020-06-09 DIAGNOSIS — M25611 Stiffness of right shoulder, not elsewhere classified: Secondary | ICD-10-CM

## 2020-06-09 DIAGNOSIS — M542 Cervicalgia: Secondary | ICD-10-CM | POA: Diagnosis not present
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
## 2020-06-18 ENCOUNTER — Ambulatory Visit: Payer: BC Managed Care – PPO | Admitting: Physical Therapy

## 2020-06-18 ENCOUNTER — Other Ambulatory Visit: Payer: Self-pay

## 2020-06-18 DIAGNOSIS — M25611 Stiffness of right shoulder, not elsewhere classified: Secondary | ICD-10-CM | POA: Diagnosis not present

## 2020-06-18 DIAGNOSIS — M25612 Stiffness of left shoulder, not elsewhere classified: Secondary | ICD-10-CM

## 2020-06-18 DIAGNOSIS — M542 Cervicalgia: Secondary | ICD-10-CM | POA: Diagnosis not present

## 2020-06-18 DIAGNOSIS — M6281 Muscle weakness (generalized): Secondary | ICD-10-CM | POA: Diagnosis not present

## 2020-06-18 NOTE — Therapy (Signed)
[facility] [address] [place], [place], [address] Phone: [phone]   Fax:  [phone]  Physical Therapy Treatment  Patient Details  Name: Cheyenne Walters MRN: [id number] Date of Birth: 08/21/1971 Referring Provider (PT): Dr. Betty Martinique   Encounter Date: 06/18/2020   PT End of Session - 06/18/20 1157    Visit Number 7    Date for PT Re-Evaluation 07/01/20    Authorization Type BCBS 90 visit limit    PT Start Time 1100    PT Stop Time 1145    PT Time Calculation (min) 45 min    Activity Tolerance Patient tolerated treatment well           Past Medical History:  Diagnosis Date  . Anemia   . Bumps on skin    left lower abdomen  . Chicken pox   . GERD (gastroesophageal reflux disease)   . Headache    Migraines  . Irregular heart beat   . Sarcoidosis     Past Surgical History:  Procedure Laterality Date  . COLONOSCOPY    . HYSTERECTOMY ABDOMINAL WITH SALPINGECTOMY Bilateral 05/15/2017   Procedure: HYSTERECTOMY ABDOMINAL WITH SALPINGECTOMY;  Surgeon: Princess Bruins, MD;  Location: [facility];  Service: Gynecology;  Laterality: Bilateral;  request to follow around 11:30am  requests 1 1/2 hours for the case.  . TUBAL LIGATION      There were no vitals filed for this visit.   Subjective Assessment - 06/18/20 1104    Subjective I'm a little better but when I go to work it [employer].  It's more in my low back than my neck.  Sharp pains in wrist at times.  Pain gets better when I massage it.    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 get back to myself    Currently in Pain? Yes    Pain Score 4     Pain Location Neck    Pain Orientation Left              [facility] PT Assessment - 06/18/20 0001      AROM   Right Shoulder Flexion 146 Degrees    Right Shoulder ABduction 126  Degrees    Left Shoulder Flexion 135 Degrees    Left Shoulder ABduction 96 Degrees    Cervical Flexion 42    Cervical Extension 42    Cervical - Right Side Bend 32    Cervical - Left Side Bend 46    Cervical - Right Rotation 55    Cervical - Left Rotation 55                         [facility] Adult PT Treatment/Exercise - 06/18/20 0001      Neck Exercises: Machines for Strengthening   Nustep 6 min L1 UE/LEs    PT present to discuss progress     Neck Exercises: Seated   Other Seated Exercise median neural glide 7x left       Neck Exercises: Supine   Other Supine Exercise foam roll Melt method rotations, circles, nods 6-7x each     Other Supine Exercise lying on foam roll vertically with deep breathing, bil UE flexion and swim arms 5x each       Shoulder Exercises: Supine   Other Supine Exercises thoracic extension foam roll  5x       Shoulder Exercises: Sidelying   Other Sidelying Exercises open book x10 right/left       Shoulder Exercises: Standing   Extension Strengthening;Both;15 reps;Theraband    Theraband Level (Shoulder Extension) Level 2 (Red)    Extension Limitations handles for home     Row Strengthening;Both;15 reps;Theraband    Theraband Level (Shoulder Row) Level 2 (Red)    Row Limitations given handles for home       Shoulder Exercises: ROM/Strengthening   UBE (Upper Arm Bike) Level 1 x 6 minutes    PT present to discuss progress and provide posture cues                   PT Short Term Goals - 06/02/20 0939      PT SHORT TERM GOAL #1   Title The patient will demonstrate knowledge of basic self care and HEP to promote healing    Status Achieved      PT SHORT TERM GOAL #2   Title The patient will report a 30% improvement in neck and bil shoulder pain with dressing, washing her back and reaching into cabinets    Baseline 30% reported    Status Achieved      PT SHORT TERM GOAL #3   Title The patient will have 150 degrees of bil shoulder  flexion ROM needed to reach to above head level shelves    Baseline 130 degrees Lt shoulder with pain      PT SHORT TERM GOAL #4   Title Cervical rotation improved to 40 degrees bil needed for driving    Status Achieved             PT Long Term Goals - 05/06/20 1515      PT LONG TERM GOAL #1   Title The patient will be independent in safe self progression of HEP    Time 8    Period Weeks    Status New    Target Date 07/01/20      PT LONG TERM GOAL #2   Title The patient will report a 60% improvement in neck and shoulder pain with sleeping, bathing, dressing and cooking ADLS    Time 8    Period Weeks    Status New      PT LONG TERM GOAL #3   Title The patient will have grossly 4/5 UE strength needed for lifting for work duties.    Time 8    Period Weeks    Status New      PT LONG TERM GOAL #4   Title Cervical ROM improved to 35 degrees flexion, 30 degrees extension and 30 degrees of sidebending needed for work duties    Time 8    Period Weeks    Status New      PT LONG TERM GOAL #5   Title FOTO functional outcome score improved from 46% limitation to 28%    Time 8    Period Weeks    Status New                 Plan - 06/18/20 1158    Clinical Impression Statement The patient reports some improvement over the last few weeks but her symptoms especially wrist pain and thumb, index finger tingling worsened with work activities.  Discussed strategies for management of these symptoms and modified home theraband with handles for comfort.  Improving left shoulder ROM and cervical ROM although still quite limited.  She is unsure if cervical traction is helping or not so we elected to hold on this intervention today but may resume at some point if deemed beneficial.  Therapist monitoring response with all interventions and modifying treatment accordingly.    Rehab Potential Good    PT Frequency 2x / week    PT Duration 8 weeks    PT Treatment/Interventions ADLs/Self  Care Home Management;Cryotherapy;Electrical Stimulation;Ultrasound;Traction;Moist Heat;Neuromuscular re-education;Therapeutic exercise;Therapeutic activities;Patient/family education;Manual techniques;Dry needling;Taping;Iontophoresis 4mg /ml Dexamethasone    PT Next Visit Plan left > right shoulder ROM;  upper quarter strength, thoracic/cervical  ROM;  neural gliding    PT Home Exercise Plan [email or web address]           Patient will benefit from skilled therapeutic intervention in order to improve the following deficits and impairments:  Decreased range of motion, Increased fascial restricitons, Pain, Impaired UE functional use, Impaired perceived functional ability, Decreased strength, Decreased activity tolerance  Visit Diagnosis: Cervicalgia  Stiffness of right shoulder, not elsewhere classified  Stiffness of left shoulder, not elsewhere classified  Muscle weakness (generalized)     Problem List Patient Active Problem List   Diagnosis Date Noted  . Sarcoidosis 10/07/2018  . Postoperative state 05/15/2017  . GERD (gastroesophageal reflux disease) 09/05/2016  . Anemia, iron deficiency 09/05/2016  . Hyperlipidemia 09/05/2016  . Class 1 obesity with body mass index (BMI) of 31.0 to 31.9 in adult 09/05/2016  . HYPERTENSION 08/20/2007  . ABNORMAL HEART RHYTHMS 08/20/2007  . COUGH 08/20/2007   Ruben Im, PT 06/18/20 12:04 PM Phone: [phone] Fax: [phone] Alvera Singh 06/18/2020, 12:03 PM  [facility] [address] [place], [place], [address] Phone: [phone]   Fax:  [phone]  Name: Heiress Williamson MRN: [id number] Date of Birth: [birth year]

## 2020-06-25 ENCOUNTER — Ambulatory Visit: Payer: BC Managed Care – PPO | Attending: Family Medicine | Admitting: Physical Therapy

## 2020-06-25 ENCOUNTER — Other Ambulatory Visit: Payer: Self-pay

## 2020-06-25 DIAGNOSIS — M25612 Stiffness of left shoulder, not elsewhere classified: Secondary | ICD-10-CM | POA: Diagnosis not present

## 2020-06-25 DIAGNOSIS — M542 Cervicalgia: Secondary | ICD-10-CM | POA: Insufficient documentation

## 2020-06-25 DIAGNOSIS — M25611 Stiffness of right shoulder, not elsewhere classified: Secondary | ICD-10-CM | POA: Diagnosis not present

## 2020-06-25 DIAGNOSIS — M6281 Muscle weakness (generalized): Secondary | ICD-10-CM | POA: Diagnosis not present

## 2020-06-25 NOTE — Therapy (Signed)
[facility] [address], [place], [place], [address] Phone: [phone]   Fax:  [phone]  Physical Therapy Treatment  Patient Details  Name: Cheyenne Walters MRN: [id number] Date of Birth: 28-Nov-1970 Referring Provider (PT): Dr. Betty Martinique   Encounter Date: 06/25/2020   PT End of Session - 06/25/20 1159    Visit Number 8    Date for PT Re-Evaluation 07/01/20    Authorization Type BCBS 90 visit limit    PT Start Time 1017    PT Stop Time 1058    PT Time Calculation (min) 41 min    Activity Tolerance Patient tolerated treatment well           Past Medical History:  Diagnosis Date  . Anemia   . Bumps on skin    left lower abdomen  . Chicken pox   . GERD (gastroesophageal reflux disease)   . Headache    Migraines  . Irregular heart beat   . Sarcoidosis     Past Surgical History:  Procedure Laterality Date  . COLONOSCOPY    . HYSTERECTOMY ABDOMINAL WITH SALPINGECTOMY Bilateral 05/15/2017   Procedure: HYSTERECTOMY ABDOMINAL WITH SALPINGECTOMY;  Surgeon: Princess Bruins, MD;  Location: [facility];  Service: Gynecology;  Laterality: Bilateral;  request to follow around 11:30am  requests 1 1/2 hours for the case.  . TUBAL LIGATION      There were no vitals filed for this visit.   Subjective Assessment - 06/25/20 1019    Subjective I have to work tomorrow.  I went for a walk this morning 15 minutes.  Last had symptoms in hand/wrist while working, forgot my wrist brace.    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 No/denies    Pain Score 0-No pain    Pain Orientation Left                             [facility] Adult PT Treatment/Exercise - 06/25/20 0001      Neck Exercises: Standing   Other Standing Exercises Wall planks 10x       Neck Exercises: Seated   Other Seated Exercise cervical  SNAGs rotation with towel 10x right/left       Neck Exercises: Supine   Other Supine Exercise lying on foam roll vertically with deep breathing, bil UE flexion and swim arms 5x each       Neck Exercises: Sidelying   Other Sidelying Exercise open books 10x right/left       Shoulder Exercises: Seated   Other Seated Exercises green band horizontal abduction 15x       Shoulder Exercises: Sidelying   Other Sidelying Exercises open book x10 right/left       Shoulder Exercises: Standing   Other Standing Exercises blue loop with wall slides, scoops, clocks 5x       Shoulder Exercises: ROM/Strengthening   UBE (Upper Arm Bike) Level 1 x 6 minutes    PT present to discuss progress and provide posture cues                 PT Education - 06/25/20 1158    Education Details towel rotation snags, wall red band scoops, slides, clocks    Person(s) Educated Patient    Methods Explanation;Demonstration;Handout    Comprehension Returned demonstration;Verbalized understanding  PT Short Term Goals - 06/02/20 6195      PT SHORT TERM GOAL #1   Title The patient will demonstrate knowledge of basic self care and HEP to promote healing    Status Achieved      PT SHORT TERM GOAL #2   Title The patient will report a 30% improvement in neck and bil shoulder pain with dressing, washing her back and reaching into cabinets    Baseline 30% reported    Status Achieved      PT SHORT TERM GOAL #3   Title The patient will have 150 degrees of bil shoulder flexion ROM needed to reach to above head level shelves    Baseline 130 degrees Lt shoulder with pain      PT SHORT TERM GOAL #4   Title Cervical rotation improved to 40 degrees bil needed for driving    Status Achieved             PT Long Term Goals - 05/06/20 1515      PT LONG TERM GOAL #1   Title The patient will be independent in safe self progression of HEP    Time 8    Period Weeks    Status New    Target Date 07/01/20       PT LONG TERM GOAL #2   Title The patient will report a 60% improvement in neck and shoulder pain with sleeping, bathing, dressing and cooking ADLS    Time 8    Period Weeks    Status New      PT LONG TERM GOAL #3   Title The patient will have grossly 4/5 UE strength needed for lifting for work duties.    Time 8    Period Weeks    Status New      PT LONG TERM GOAL #4   Title Cervical ROM improved to 35 degrees flexion, 30 degrees extension and 30 degrees of sidebending needed for work duties    Time 8    Period Weeks    Status New      PT LONG TERM GOAL #5   Title FOTO functional outcome score improved from 46% limitation to 28%    Time 8    Period Weeks    Status New                 Plan - 06/25/20 1048    Clinical Impression Statement The patient has improving ROM of bil UEs as well as cervical ROM.  Pain intensity and frequency decreasing overall and increasing activity level.  Reports less distal symptoms with wearing a wrist brace at work.  Therapist monitoring response throughout treatment session.    Examination-Activity Limitations Lift;Stand;Dressing;Sleep;Carry;Reach Overhead;Bathing    Examination-Participation Restrictions Meal Prep;Cleaning;Occupation;Driving;Laundry    Rehab Potential Good    PT Frequency 2x / week    PT Duration 8 weeks    PT Treatment/Interventions ADLs/Self Care Home Management;Cryotherapy;Electrical Stimulation;Ultrasound;Traction;Moist Heat;Neuromuscular re-education;Therapeutic exercise;Therapeutic activities;Patient/family education;Manual techniques;Dry needling;Taping;Iontophoresis 4mg /ml Dexamethasone    PT Next Visit Plan left > right shoulder ROM;  upper quarter strength, thoracic/cervical  ROM;  neural gliding;  recheck shoulder and neck ROM    PT Home Exercise Plan [email or web address]           Patient will benefit from skilled therapeutic intervention in order to improve the following deficits and impairments:  Decreased range  of motion, Increased fascial restricitons, Pain, Impaired UE functional use, Impaired perceived functional ability, Decreased strength, Decreased  activity tolerance  Visit Diagnosis: Cervicalgia  Stiffness of right shoulder, not elsewhere classified  Stiffness of left shoulder, not elsewhere classified  Muscle weakness (generalized)     Problem List Patient Active Problem List   Diagnosis Date Noted  . Sarcoidosis 10/07/2018  . Postoperative state 05/15/2017  . GERD (gastroesophageal reflux disease) 09/05/2016  . Anemia, iron deficiency 09/05/2016  . Hyperlipidemia 09/05/2016  . Class 1 obesity with body mass index (BMI) of 31.0 to 31.9 in adult 09/05/2016  . HYPERTENSION 08/20/2007  . ABNORMAL HEART RHYTHMS 08/20/2007  . COUGH 08/20/2007   Ruben Im, PT 06/25/20 12:07 PM Phone: [phone] Fax: [phone] Alvera Singh 06/25/2020, 12:07 PM  [facility] [address] [place], [place], [address] Phone: [phone]   Fax:  [phone]  Name: Cheyenne Walters MRN: [id number] Date of Birth: 1971-02-09

## 2020-06-25 NOTE — Patient Instructions (Signed)
Access Code: [email or web address] URL: [email or web address] Date: 06/25/2020 Prepared by: Ruben Im  Exercises Seated Cervical Rotation AROM - 4 x daily - 7 x weekly - 3 sets - 3 reps Seated Cervical Flexion AROM - 4 x daily - 7 x weekly - 3 sets - 3 reps Standing Cervical Sidebending AROM - 4 x daily - 7 x weekly - 3 sets - 3 reps Seated Scapular Retraction - 4 x daily - 7 x weekly - 3 sets - 3 reps doorway hip flexor stretch - 1 x daily - 7 x weekly - 3 sets - 5 reps Median Nerve Flossing - 1 x daily - 7 x weekly - 1 sets - 10 reps Standing Row with Anchored Resistance - 1 x daily - 7 x weekly - 2 sets - 10 reps Standing Shoulder Extension with Resistance - 1 x daily - 7 x weekly - 2 sets - 10 reps Seated Assisted Cervical Rotation with Towel - 1 x daily - 7 x weekly - 1 sets - 10 reps Wall Clock with Theraband - 1 x daily - 7 x weekly - 3 sets - 5 reps  Patient Education Sleep Hygiene

## 2020-07-01 ENCOUNTER — Other Ambulatory Visit: Payer: Self-pay

## 2020-07-01 ENCOUNTER — Ambulatory Visit: Payer: BC Managed Care – PPO | Admitting: Physical Therapy

## 2020-07-01 DIAGNOSIS — M25612 Stiffness of left shoulder, not elsewhere classified: Secondary | ICD-10-CM | POA: Diagnosis not present

## 2020-07-01 DIAGNOSIS — M25611 Stiffness of right shoulder, not elsewhere classified: Secondary | ICD-10-CM

## 2020-07-01 DIAGNOSIS — M6281 Muscle weakness (generalized): Secondary | ICD-10-CM

## 2020-07-01 DIAGNOSIS — M542 Cervicalgia: Secondary | ICD-10-CM

## 2020-07-01 NOTE — Therapy (Signed)
[facility] [address] [place], [place], [address] Phone: [phone]   Fax:  [phone]  Physical Therapy Treatment/Recertification   Patient Details  Name: Cheyenne Walters MRN: [id number] Date of Birth: Oct 15, 1970 Referring Provider (PT): Dr. Betty Martinique   Encounter Date: 07/01/2020   PT End of Session - 07/01/20 1205    Visit Number 9    Date for PT Re-Evaluation 09/23/20    Authorization Type BCBS 90 visit limit    PT Start Time 1017    PT Stop Time 1058    PT Time Calculation (min) 41 min    Activity Tolerance Patient tolerated treatment well           Past Medical History:  Diagnosis Date  . Anemia   . Bumps on skin    left lower abdomen  . Chicken pox   . GERD (gastroesophageal reflux disease)   . Headache    Migraines  . Irregular heart beat   . Sarcoidosis     Past Surgical History:  Procedure Laterality Date  . COLONOSCOPY    . HYSTERECTOMY ABDOMINAL WITH SALPINGECTOMY Bilateral 05/15/2017   Procedure: HYSTERECTOMY ABDOMINAL WITH SALPINGECTOMY;  Surgeon: Princess Bruins, MD;  Location: [facility];  Service: Gynecology;  Laterality: Bilateral;  request to follow around 11:30am  requests 1 1/2 hours for the case.  . TUBAL LIGATION      There were no vitals filed for this visit.   Subjective Assessment - 07/01/20 1017    Subjective Work is OK.   Morning stiffness but less.    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 Yes    Pain Score 6     Pain Location Neck              [facility] PT Assessment - 07/01/20 0001      Observation/Other Assessments   Focus on Therapeutic Outcomes (FOTO)  43% limitation       AROM   Right Shoulder Flexion 160 Degrees    Right Shoulder ABduction 147 Degrees    Left Shoulder Flexion 143 Degrees    Left Shoulder ABduction 125 Degrees    Cervical Flexion  48    Cervical Extension 42    Cervical - Right Side Bend 38    Cervical - Left Side Bend 45    Cervical - Right Rotation 55    Cervical - Left Rotation 55      Strength   Overall Strength Comments 4/5 grossly UEs                         [facility] Adult PT Treatment/Exercise - 07/01/20 0001      Therapeutic Activites    Therapeutic Activities Lifting;Work Economist;Other Therapeutic Activities    Lifting 2 5# dumbells 2x10     Other Therapeutic Activities hip hinge with golf club 15x       Neck Exercises: Standing   Other Standing Exercises counter top push ups 15x       Shoulder Exercises: Standing   Extension Strengthening;Both;15 reps;Theraband    Theraband Level (Shoulder Extension) Level 2 (Red)    Row Strengthening;Both;15 reps;Theraband    Theraband Level (Shoulder Row) Level 2 (Red)    Other Standing Exercises 5# bicep curls 10x bil       Shoulder Exercises: ROM/Strengthening   Ranger L  19 15x right/left                   PT Education - 07/01/20 1205    Education Details counter push ups; 1/2 dead lift; hip hinge golf club    Person(s) Educated Patient    Methods Explanation;Demonstration;Handout    Comprehension Verbalized understanding;Returned demonstration            PT Short Term Goals - 07/01/20 1210      PT SHORT TERM GOAL #1   Title The patient will demonstrate knowledge of basic self care and HEP to promote healing    Status Achieved      PT SHORT TERM GOAL #2   Title The patient will report a 30% improvement in neck and bil shoulder pain with dressing, washing her back and reaching into cabinets    Status Achieved      PT SHORT TERM GOAL #3   Title The patient will have 150 degrees of bil shoulder flexion ROM needed to reach to above head level shelves    Status Partially Met      PT SHORT TERM GOAL #4   Title Cervical rotation improved to 40 degrees bil needed for driving    Status Achieved             PT Long  Term Goals - 07/01/20 1030      PT LONG TERM GOAL #1   Title The patient will be independent in safe self progression of HEP    Time 8    Period Weeks    Status On-going    Target Date 09/23/20      PT LONG TERM GOAL #2   Title The patient will report a 60% improvement in neck and shoulder pain with sleeping, bathing, dressing and cooking ADLS    Time 12    Period Weeks    Status On-going      PT LONG TERM GOAL #3   Title The patient will have grossly 4+/5 UE strength needed for lifting for work duties.    Time 12    Period Weeks    Status On-going      PT LONG TERM GOAL #4   Title Cervical ROM improved to 35 degrees flexion, 30 degrees extension and 30 degrees of sidebending needed for work duties    Time 12    Period Weeks    Status Achieved      PT LONG TERM GOAL #5   Title FOTO functional outcome score improved from 46% limitation to 28%    Time 12    Period Weeks    Status On-going      Additional Long Term Goals   Additional Long Term Goals Yes      PT LONG TERM GOAL #6   Title Patient will be able to drive for longer periods of time ([place]) without stopping    Time 8    Period Weeks    Status New    Target Date 08/26/20      PT LONG TERM GOAL #7   Title Bil shoulder flexion to 165 degrees bil for reaching overhead.    Time 8    Period Weeks    Status New                 Plan - 07/01/20 1051    Clinical Impression Statement The patient rates her overall improvement at 55%.  Her cervical ROM has improved significantly in all  planes.  Her shoulder has improved as well but still limited left > right.  Her UE strength has improved to grossly 4/5 but she continues to avoid lifting.  She would benefit from continued PT at a decreased frequency of 1x/week for further improvements in ROM, strength and return to full function.    Examination-Activity Limitations Lift;Stand;Dressing;Sleep;Carry;Reach Overhead;Bathing    Examination-Participation  Restrictions Meal Prep;Cleaning;Occupation;Driving;Laundry    Rehab Potential Good    PT Frequency 1x / week    PT Duration 12 weeks    PT Treatment/Interventions ADLs/Self Care Home Management;Cryotherapy;Electrical Stimulation;Ultrasound;Traction;Moist Heat;Neuromuscular re-education;Therapeutic exercise;Therapeutic activities;Patient/family education;Manual techniques;Dry needling;Taping;Iontophoresis 51m/ml Dexamethasone    PT Next Visit Plan left > right shoulder ROM;  upper quarter strength, thoracic/cervical  ROM;  progress dead lifting    PT Home Exercise Plan [email or web address]           Patient will benefit from skilled therapeutic intervention in order to improve the following deficits and impairments:  Decreased range of motion, Increased fascial restricitons, Pain, Impaired UE functional use, Impaired perceived functional ability, Decreased strength, Decreased activity tolerance  Visit Diagnosis: Cervicalgia - Plan: PT plan of care cert/re-cert  Stiffness of right shoulder, not elsewhere classified - Plan: PT plan of care cert/re-cert  Stiffness of left shoulder, not elsewhere classified - Plan: PT plan of care cert/re-cert  Muscle weakness (generalized) - Plan: PT plan of care cert/re-cert     Problem List Patient Active Problem List   Diagnosis Date Noted  . Sarcoidosis 10/07/2018  . Postoperative state 05/15/2017  . GERD (gastroesophageal reflux disease) 09/05/2016  . Anemia, iron deficiency 09/05/2016  . Hyperlipidemia 09/05/2016  . Class 1 obesity with body mass index (BMI) of 31.0 to 31.9 in adult 09/05/2016  . HYPERTENSION 08/20/2007  . ABNORMAL HEART RHYTHMS 08/20/2007  . COUGH 08/20/2007   SRuben Im PT 07/01/20 12:17 PM Phone: [phone]: [phone] Singh10/03/2020, 12:16 PM  [facility] [address] [place] [address]Phone: [phone]  Fax:   [phone] Name: AAirel MagadanMRN: [id number] of Birth: 713-Feb-1972

## 2020-07-01 NOTE — Patient Instructions (Signed)
Access Code: [email or web address] URL: [email or web address] Date: 07/01/2020 Prepared by: Ruben Im  Exercises Seated Cervical Rotation AROM - 4 x daily - 7 x weekly - 3 sets - 3 reps Seated Cervical Flexion AROM - 4 x daily - 7 x weekly - 3 sets - 3 reps Standing Cervical Sidebending AROM - 4 x daily - 7 x weekly - 3 sets - 3 reps Seated Scapular Retraction - 4 x daily - 7 x weekly - 3 sets - 3 reps doorway hip flexor stretch - 1 x daily - 7 x weekly - 3 sets - 5 reps Median Nerve Flossing - 1 x daily - 7 x weekly - 1 sets - 10 reps Standing Row with Anchored Resistance - 1 x daily - 7 x weekly - 2 sets - 10 reps Standing Shoulder Extension with Resistance - 1 x daily - 7 x weekly - 2 sets - 10 reps Seated Assisted Cervical Rotation with Towel - 1 x daily - 7 x weekly - 1 sets - 10 reps Wall Clock with Theraband - 1 x daily - 7 x weekly - 3 sets - 5 reps Standing Hip Hinge with Dowel - 1 x daily - 7 x weekly - 1 sets - 10 reps Half Dead Lift with Kettlebell - 1 x daily - 7 x weekly - 1 sets - 10 reps Standing Bicep Curls Supinated with Dumbbells - 1 x daily - 7 x weekly - 1 sets - 10 reps Push-Up on Counter - 1 x daily - 7 x weekly - 1 sets - 10 reps  Patient Education Sleep Hygiene

## 2020-07-08 ENCOUNTER — Ambulatory Visit: Payer: BC Managed Care – PPO | Admitting: Physical Therapy

## 2020-07-08 ENCOUNTER — Other Ambulatory Visit: Payer: Self-pay

## 2020-07-08 DIAGNOSIS — M6281 Muscle weakness (generalized): Secondary | ICD-10-CM

## 2020-07-08 DIAGNOSIS — M542 Cervicalgia: Secondary | ICD-10-CM

## 2020-07-08 DIAGNOSIS — M25611 Stiffness of right shoulder, not elsewhere classified: Secondary | ICD-10-CM

## 2020-07-08 DIAGNOSIS — M25612 Stiffness of left shoulder, not elsewhere classified: Secondary | ICD-10-CM

## 2020-07-08 NOTE — Therapy (Signed)
[facility] [address] [place], [place], [address] Phone: [phone]   Fax:  [phone]  Physical Therapy Treatment  Patient Details  Name: Cheyenne Walters MRN: [id number] Date of Birth: 04-Feb-1971 Referring Provider (PT): Dr. Betty Martinique   Encounter Date: 07/08/2020   PT End of Session - 07/08/20 1142    Visit Number 10    Date for PT Re-Evaluation 09/23/20    Authorization Type BCBS 90 visit limit    PT Start Time 1058    PT Stop Time 1136    PT Time Calculation (min) 38 min    Activity Tolerance Patient tolerated treatment well           Past Medical History:  Diagnosis Date  . Anemia   . Bumps on skin    left lower abdomen  . Chicken pox   . GERD (gastroesophageal reflux disease)   . Headache    Migraines  . Irregular heart beat   . Sarcoidosis     Past Surgical History:  Procedure Laterality Date  . COLONOSCOPY    . HYSTERECTOMY ABDOMINAL WITH SALPINGECTOMY Bilateral 05/15/2017   Procedure: HYSTERECTOMY ABDOMINAL WITH SALPINGECTOMY;  Surgeon: Princess Bruins, MD;  Location: [facility];  Service: Gynecology;  Laterality: Bilateral;  request to follow around 11:30am  requests 1 1/2 hours for the case.  . TUBAL LIGATION      There were no vitals filed for this visit.   Subjective Assessment - 07/08/20 1059    Subjective I'm OK today.  My whole body was just sore earlier in the week.  I think it was from standing on a concrete floor all weekend.    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 tried to stand 5 hours but that bothered me    Diagnostic tests x-rays neck, shoulder, back and knee    Currently in Pain? Yes    Pain Score 3     Pain Location Shoulder    Multiple Pain Sites Yes    Pain Score 7    Pain Location Back                             [facility]  Adult PT Treatment/Exercise - 07/08/20 0001      Shoulder Exercises: Standing   Row Strengthening;Right;Left;10 reps;Theraband    Theraband Level (Shoulder Row) Level 3 (Green)    Row Limitations archer's position    Other Standing Exercises green loop steering wheels 10x     Other Standing Exercises ball roll overhead on wall 5x       Shoulder Exercises: ROM/Strengthening   UBE (Upper Arm Bike) Level 1 x 5 minutes    PT present to discuss progress and provide posture cues   Ranger L 19 10x flexion and 10x abduction left     Pushups 10 reps    Pushups Limitations high table push ups 10x     Plank 5 reps    Plank Limitations high table opposite arm taps     Ball on Wall 1/2 way ABCs right/left       Shoulder Exercises: Stretch   Other Shoulder Stretches lat stretch on power tower with opposite arm open books    Other Shoulder Stretches foam roll childs pose roll outs 5x and thread the needle with foam  roll 5x each side       Shoulder Exercises: Power [employer] 20 reps    Row Limitations 20# seated machine    Other Power Tower Exercises lat bar seated 20# 2x10                    PT Short Term Goals - 07/01/20 1210      PT SHORT TERM GOAL #1   Title The patient will demonstrate knowledge of basic self care and HEP to promote healing    Status Achieved      PT SHORT TERM GOAL #2   Title The patient will report a 30% improvement in neck and bil shoulder pain with dressing, washing her back and reaching into cabinets    Status Achieved      PT SHORT TERM GOAL #3   Title The patient will have 150 degrees of bil shoulder flexion ROM needed to reach to above head level shelves    Status Partially Met      PT SHORT TERM GOAL #4   Title Cervical rotation improved to 40 degrees bil needed for driving    Status Achieved             PT Long Term Goals - 07/01/20 1030      PT LONG TERM GOAL #1   Title The patient will be independent in safe self progression of HEP     Time 8    Period Weeks    Status On-going    Target Date 09/23/20      PT LONG TERM GOAL #2   Title The patient will report a 60% improvement in neck and shoulder pain with sleeping, bathing, dressing and cooking ADLS    Time 12    Period Weeks    Status On-going      PT LONG TERM GOAL #3   Title The patient will have grossly 4+/5 UE strength needed for lifting for work duties.    Time 12    Period Weeks    Status On-going      PT LONG TERM GOAL #4   Title Cervical ROM improved to 35 degrees flexion, 30 degrees extension and 30 degrees of sidebending needed for work duties    Time 12    Period Weeks    Status Achieved      PT LONG TERM GOAL #5   Title FOTO functional outcome score improved from 46% limitation to 28%    Time 12    Period Weeks    Status On-going      Additional Long Term Goals   Additional Long Term Goals Yes      PT LONG TERM GOAL #6   Title Patient will be able to drive for longer periods of time ([place]) without stopping    Time 8    Period Weeks    Status New    Target Date 08/26/20      PT LONG TERM GOAL #7   Title Bil shoulder flexion to 165 degrees bil for reaching overhead.    Time 8    Period Weeks    Status New                 Plan - 07/08/20 1143    Clinical Impression Statement The patient reports generally sore all over which she attributes to her work/standing on a concrete floor.  Her shoulder ROM continues to improve but still limited in all  lanes Left more than right.  She is able to progress with UE weight bearing and increased resistance although she does fatigue quickly.  She has some increase in pain with exercise but remains 5/10 or less.  She is not fearful of physical activity and continues to opt for more challenging version of exercise instead of opting for the easier version.  Therapist monitoring response throughout treatment session.    Examination-Activity Limitations Lift;Stand;Dressing;Sleep;Carry;Reach  Overhead;Bathing    Rehab Potential Good    PT Frequency 1x / week    PT Duration 12 weeks    PT Treatment/Interventions ADLs/Self Care Home Management;Cryotherapy;Electrical Stimulation;Ultrasound;Traction;Moist Heat;Neuromuscular re-education;Therapeutic exercise;Therapeutic activities;Patient/family education;Manual techniques;Dry needling;Taping;Iontophoresis 60m/ml Dexamethasone    PT Next Visit Plan left > right shoulder ROM;  upper quarter strength, thoracic/cervical  ROM    PT Home Exercise Plan [email or web address]           Patient will benefit from skilled therapeutic intervention in order to improve the following deficits and impairments:  Decreased range of motion, Increased fascial restricitons, Pain, Impaired UE functional use, Impaired perceived functional ability, Decreased strength, Decreased activity tolerance  Visit Diagnosis: Cervicalgia  Stiffness of right shoulder, not elsewhere classified  Stiffness of left shoulder, not elsewhere classified  Muscle weakness (generalized)     Problem List Patient Active Problem List   Diagnosis Date Noted  . Sarcoidosis 10/07/2018  . Postoperative state 05/15/2017  . GERD (gastroesophageal reflux disease) 09/05/2016  . Anemia, iron deficiency 09/05/2016  . Hyperlipidemia 09/05/2016  . Class 1 obesity with body mass index (BMI) of 31.0 to 31.9 in adult 09/05/2016  . HYPERTENSION 08/20/2007  . ABNORMAL HEART RHYTHMS 08/20/2007  . COUGH 08/20/2007   SRuben Im PT 07/08/20 11:49 AM Phone: [phone]: [phone] Singh10/14/2021, 11:49 AM  [facility] [address] [place] [address]Phone: [phone]  Fax:  [phone] Name: AGeneveive FurnessMRN: [id number] of Birth: [birth year]

## 2020-07-15 ENCOUNTER — Other Ambulatory Visit: Payer: Self-pay

## 2020-07-15 ENCOUNTER — Encounter: Payer: Self-pay | Admitting: Family Medicine

## 2020-07-15 ENCOUNTER — Ambulatory Visit: Payer: BC Managed Care – PPO

## 2020-07-15 DIAGNOSIS — M25612 Stiffness of left shoulder, not elsewhere classified: Secondary | ICD-10-CM

## 2020-07-15 DIAGNOSIS — M6281 Muscle weakness (generalized): Secondary | ICD-10-CM

## 2020-07-15 DIAGNOSIS — M542 Cervicalgia: Secondary | ICD-10-CM

## 2020-07-15 DIAGNOSIS — M25611 Stiffness of right shoulder, not elsewhere classified: Secondary | ICD-10-CM | POA: Diagnosis not present

## 2020-07-15 NOTE — Therapy (Signed)
[facility] [address], [place], [place], [address] Phone: [phone]   Fax:  [phone]  Physical Therapy Treatment  Patient Details  Name: Cheyenne Walters MRN: [id number] Date of Birth: 1971-06-28 Referring Provider (PT): Dr. Betty Martinique   Encounter Date: 07/15/2020   PT End of Session - 07/15/20 1049    Visit Number 11    Date for PT Re-Evaluation 09/23/20    Authorization Type BCBS 90 visit limit    PT Start Time 1015    PT Stop Time 1049    PT Time Calculation (min) 34 min    Activity Tolerance Patient tolerated treatment well    Behavior During Therapy [facility] for tasks assessed/performed           Past Medical History:  Diagnosis Date  . Anemia   . Bumps on skin    left lower abdomen  . Chicken pox   . GERD (gastroesophageal reflux disease)   . Headache    Migraines  . Irregular heart beat   . Sarcoidosis     Past Surgical History:  Procedure Laterality Date  . COLONOSCOPY    . HYSTERECTOMY ABDOMINAL WITH SALPINGECTOMY Bilateral 05/15/2017   Procedure: HYSTERECTOMY ABDOMINAL WITH SALPINGECTOMY;  Surgeon: Princess Bruins, MD;  Location: [facility];  Service: Gynecology;  Laterality: Bilateral;  request to follow around 11:30am  requests 1 1/2 hours for the case.  . TUBAL LIGATION      There were no vitals filed for this visit.   Subjective Assessment - 07/15/20 1015    Subjective I am not good this week.  I am having sciatic nerve pain on the Rt that started 10/18 and my Rt hand is cramping now.  I wanted to wait it out and see how things went this week before contacting the MD.    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 Yes    Pain Score 1    7/10 Rt sciatic nerve   Pain Location Shoulder    Pain Orientation Left    Pain Descriptors / Indicators Aching;Tightness    Pain Score 3    Pain  Location --   neck/upper back   Pain Orientation Upper;Left;Right    Pain Descriptors / Indicators Aching                             [facility] Adult PT Treatment/Exercise - 07/15/20 0001      Neck Exercises: Prone   Other Prone Exercise prone on elbows x 1 minute- reduces Rt LE pain      Shoulder Exercises: Seated   Other Seated Exercises ball roll outs: forward and lateral bil x10 each      Shoulder Exercises: Sidelying   Other Sidelying Exercises open book x10 right/left       Shoulder Exercises: Standing   Horizontal ABduction Strengthening;Both;20 reps;Theraband    Theraband Level (Shoulder Horizontal ABduction) Level 2 (Red)    Diagonals Strengthening;Both;20 reps;Theraband    Other Standing Exercises lumbar extension x 10      Shoulder Exercises: ROM/Strengthening   UBE (Upper Arm Bike) Level 1 x 7 minutes    PT present to discuss progress and provide posture cues   Ranger L 19 10x flexion and 10x abduction left  PT Short Term Goals - 07/01/20 1210      PT SHORT TERM GOAL #1   Title The patient will demonstrate knowledge of basic self care and HEP to promote healing    Status Achieved      PT SHORT TERM GOAL #2   Title The patient will report a 30% improvement in neck and bil shoulder pain with dressing, washing her back and reaching into cabinets    Status Achieved      PT SHORT TERM GOAL #3   Title The patient will have 150 degrees of bil shoulder flexion ROM needed to reach to above head level shelves    Status Partially Met      PT SHORT TERM GOAL #4   Title Cervical rotation improved to 40 degrees bil needed for driving    Status Achieved             PT Long Term Goals - 07/15/20 1030      PT LONG TERM GOAL #1   Title The patient will be independent in safe self progression of HEP    Period Weeks    Status On-going      PT LONG TERM GOAL #2   Title The patient will report a 60% improvement in neck and  shoulder pain with sleeping, bathing, dressing and cooking ADLS    Baseline 42%    Time 12    Status On-going      PT LONG TERM GOAL #6   Title Patient will be able to drive for longer periods of time ([place]) without stopping    Baseline has not driven this far    Time 8    Period Weeks    Status On-going                 Plan - 07/15/20 1034    Clinical Impression Statement Pt arrived today with onset of Rt "sciatic nerve" pain and Rt hand pain.  Pt reports an overall improvement in her original pain in the Lt arm and neck/thoracic spine and reports 60% overall reduction.  Pt reports 7/10 pain in the Rt LE (aching/soreness)  and UE today and activity was modified for this.  Seated hamstring stretch increased Rt LE pain and it was reduced with standing extension and prone on elbows.  Pt was advised to try this motion to reduce LE pain.  Pt tolerated gentle flexibility and strength exercises today Therapist monitoring response throughout treatment session.  Pt will continue to benefit from skilled PT to address Lt UE strength and flexibility, cervical flexibility and postural strength.    PT Frequency 1x / week    PT Duration 12 weeks    PT Treatment/Interventions ADLs/Self Care Home Management;Cryotherapy;Electrical Stimulation;Ultrasound;Traction;Moist Heat;Neuromuscular re-education;Therapeutic exercise;Therapeutic activities;Patient/family education;Manual techniques;Dry needling;Taping;Iontophoresis 4mg /ml Dexamethasone    PT Next Visit Plan left > right shoulder ROM;  upper quarter strength, thoracic/cervical  ROM.  See if pt has contaced the MD regarding Rt LE pain    PT Home Exercise Plan [email or web address]    Consulted and Agree with Plan of Care Patient           Patient will benefit from skilled therapeutic intervention in order to improve the following deficits and impairments:  Decreased range of motion, Increased fascial restricitons, Pain, Impaired UE functional use,  Impaired perceived functional ability, Decreased strength, Decreased activity tolerance  Visit Diagnosis: Stiffness of right shoulder, not elsewhere classified  Cervicalgia  Stiffness of left shoulder, not elsewhere classified  Muscle weakness (generalized)     Problem List Patient Active Problem List   Diagnosis Date Noted  . Sarcoidosis 10/07/2018  . Postoperative state 05/15/2017  . GERD (gastroesophageal reflux disease) 09/05/2016  . Anemia, iron deficiency 09/05/2016  . Hyperlipidemia 09/05/2016  . Class 1 obesity with body mass index (BMI) of 31.0 to 31.9 in adult 09/05/2016  . HYPERTENSION 08/20/2007  . ABNORMAL HEART RHYTHMS 08/20/2007  . COUGH 08/20/2007    [name], PT 07/15/20 10:51 AM  [facility] [address] [place], [place], [address] Phone: [phone]   Fax:  [phone]  Name: Cheyenne Walters MRN: [id number] Date of Birth: 1971-09-23

## 2020-07-21 ENCOUNTER — Other Ambulatory Visit: Payer: Self-pay

## 2020-07-21 ENCOUNTER — Ambulatory Visit (INDEPENDENT_AMBULATORY_CARE_PROVIDER_SITE_OTHER): Payer: BC Managed Care – PPO | Admitting: Family Medicine

## 2020-07-21 ENCOUNTER — Encounter: Payer: Self-pay | Admitting: Family Medicine

## 2020-07-21 VITALS — BP 128/70 | HR 94 | Resp 16 | Ht 62.0 in | Wt 171.5 lb

## 2020-07-21 DIAGNOSIS — M7541 Impingement syndrome of right shoulder: Secondary | ICD-10-CM

## 2020-07-21 DIAGNOSIS — M5441 Lumbago with sciatica, right side: Secondary | ICD-10-CM | POA: Diagnosis not present

## 2020-07-21 MED ORDER — PREDNISONE 20 MG PO TABS: ORAL_TABLET | ORAL | 0 refills | Status: DC
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
## 2020-07-21 NOTE — Patient Instructions (Signed)
A few things to remember from today's visit:   Shoulder impingement syndrome, right  Acute right-sided low back pain with right-sided sciatica - Plan: BASIC METABOLIC PANEL WITH GFR, predniSONE (DELTASONE) 20 MG tablet  Take prednisone with food, breakfast. If kidney function is normal we can start either Celebrex or diclofenac. We are adding PT for shoulder and lower back.  If you need refills please call your pharmacy. Do not use My Chart to request refills or for acute issues that need immediate attention.    Please be sure medication list is accurate. If a new problem present, please set up appointment sooner than planned today.

## 2020-07-22 ENCOUNTER — Encounter: Payer: Self-pay | Admitting: Family Medicine

## 2020-07-22 LAB — BASIC METABOLIC PANEL WITH GFR
BUN: 12 mg/dL (ref 7–25)
CO2: 24 mmol/L (ref 20–32)
Calcium: 9.3 mg/dL (ref 8.6–10.4)
Chloride: 105 mmol/L (ref 98–110)
Creat: 0.72 mg/dL (ref 0.50–1.05)
GFR, Est African American: 112 mL/min/{1.73_m2} (ref >=60)
GFR, Est Non African American: 97 mL/min/{1.73_m2} (ref >=60)
Glucose, Bld: 98 mg/dL (ref 65–99)
Potassium: 4.2 mmol/L (ref 3.5–5.3)
Sodium: 138 mmol/L (ref 135–146)

## 2020-07-22 MED ORDER — CELECOXIB 100 MG PO CAPS: 100.0000 mg | ORAL_CAPSULE | Freq: Two times a day (BID) | ORAL | 0 refills | Status: DC | PRN

## 2020-07-27 ENCOUNTER — Other Ambulatory Visit: Payer: Self-pay

## 2020-07-27 ENCOUNTER — Ambulatory Visit: Payer: BC Managed Care – PPO | Attending: Family Medicine

## 2020-07-27 DIAGNOSIS — M6281 Muscle weakness (generalized): Secondary | ICD-10-CM | POA: Insufficient documentation

## 2020-07-27 DIAGNOSIS — M25612 Stiffness of left shoulder, not elsewhere classified: Secondary | ICD-10-CM | POA: Diagnosis not present

## 2020-07-27 DIAGNOSIS — M25611 Stiffness of right shoulder, not elsewhere classified: Secondary | ICD-10-CM | POA: Diagnosis not present

## 2020-07-27 DIAGNOSIS — M542 Cervicalgia: Secondary | ICD-10-CM | POA: Diagnosis not present

## 2020-07-27 NOTE — Therapy (Signed)
[facility] [address], [place], [place], [address] Phone: [phone]   Fax:  [phone]  Physical Therapy Treatment  Patient Details  Name: Cheyenne Walters MRN: [id number] Date of Birth: 06/16/1971 Referring Provider (PT): Dr. Betty Martinique   Encounter Date: 07/27/2020   PT End of Session - 07/27/20 0921    Visit Number 12    Date for PT Re-Evaluation 09/23/20    Authorization Type BCBS 90 visit limit    PT Start Time 0847    PT Stop Time 0920    PT Time Calculation (min) 33 min    Activity Tolerance Patient tolerated treatment well;Patient limited by fatigue    Behavior During Therapy [facility] for tasks assessed/performed           Past Medical History:  Diagnosis Date   Anemia    Bumps on skin    left lower abdomen   Chicken pox    GERD (gastroesophageal reflux disease)    Headache    Migraines   Irregular heart beat    Sarcoidosis     Past Surgical History:  Procedure Laterality Date   COLONOSCOPY     HYSTERECTOMY ABDOMINAL WITH SALPINGECTOMY Bilateral 05/15/2017   Procedure: HYSTERECTOMY ABDOMINAL WITH SALPINGECTOMY;  Surgeon: Princess Bruins, MD;  Location: [facility];  Service: Gynecology;  Laterality: Bilateral;  request to follow around 11:30am  requests 1 1/2 hours for the case.   TUBAL LIGATION      There were no vitals filed for this visit.   Subjective Assessment - 07/27/20 0851    Subjective I thought I was going to oversleep so I didn't sleep well.  I am good when I am not working, when I work, my pain is worse.    Currently in Pain? Yes    Pain Score 6     Pain Location Shoulder    Pain Orientation Right    Pain Descriptors / Indicators Aching;Tightness    Pain Type Acute pain    Pain Onset 1 to 4 weeks ago    Aggravating Factors  work tasks, standing on concrete    Pain Relieving Factors Nothing when I am working                             [facility]  Adult PT Treatment/Exercise - 07/27/20 0001      Shoulder Exercises: Supine   Horizontal ABduction Strengthening;Both;Theraband;20 reps    Theraband Level (Shoulder Horizontal ABduction) Level 3 (Green)    Horizontal ABduction Limitations on foam roll    External Rotation Strengthening;Both;20 reps    Theraband Level (Shoulder External Rotation) Level 3 (Green)    External Rotation Limitations on foam roll    Flexion Strengthening;20 reps;Theraband      Shoulder Exercises: Seated   Other Seated Exercises ball roll outs: forward and lateral bil x10 each    Other Seated Exercises 3 way raises: 2# 2x10      Shoulder Exercises: Standing   Other Standing Exercises countertop pushups 2x10- verbal cues for full ROM      Shoulder Exercises: ROM/Strengthening   UBE (Upper Arm Bike) Level 1 x 7 minutes    PT present to discuss progress and provide posture cues                   PT Short Term Goals - 07/27/20 0852      PT SHORT TERM GOAL #2  Title The patient will report a 30% improvement in neck and bil shoulder pain with dressing, washing her back and reaching into cabinets    Baseline 30% reported    Status Achieved             PT Long Term Goals - 07/27/20 0853      PT LONG TERM GOAL #1   Title The patient will be independent in safe self progression of HEP    Time 8    Period Weeks    Status On-going      PT LONG TERM GOAL #2   Title The patient will report a 60% improvement in neck and shoulder pain with sleeping, bathing, dressing and cooking ADLS    Baseline 35%-32%    Status On-going      PT LONG TERM GOAL #6   Baseline has not driven this far    Period Weeks    Status On-going                 Plan - 07/27/20 0906    Clinical Impression Statement Pt reports an overall improvement in her original pain in the Lt arm and neck/thoracic spine and reports 60% overall reduction.  Pt reports that pain is aggravated by working and standing long periods  on concrete floors.  Pt reports 65-70% overall improvement in symptoms since the start of care.  Pt tolerated advanced weights and activity in the clinic today.  Pt required intermittent cueing for scapular relaxation with UE activity.Pt will continue to benefit from skilled PT to address Lt UE strength and flexibility, cervical flexibility, and postural strength.    Rehab Potential Good    PT Frequency 1x / week    PT Duration 12 weeks    PT Treatment/Interventions ADLs/Self Care Home Management;Cryotherapy;Electrical Stimulation;Ultrasound;Traction;Moist Heat;Neuromuscular re-education;Therapeutic exercise;Therapeutic activities;Patient/family education;Manual techniques;Dry needling;Taping;Iontophoresis 4mg /ml Dexamethasone    PT Next Visit Plan left > right shoulder ROM;  upper quarter strength, thoracic/cervical  ROM.    PT Home Exercise Plan [id number]    Consulted and Agree with Plan of Care Patient           Patient will benefit from skilled therapeutic intervention in order to improve the following deficits and impairments:  Decreased range of motion, Increased fascial restricitons, Pain, Impaired UE functional use, Impaired perceived functional ability, Decreased strength, Decreased activity tolerance  Visit Diagnosis: Stiffness of right shoulder, not elsewhere classified  Cervicalgia  Stiffness of left shoulder, not elsewhere classified  Muscle weakness (generalized)     Problem List Patient Active Problem List   Diagnosis Date Noted   Sarcoidosis 10/07/2018   Postoperative state 05/15/2017   GERD (gastroesophageal reflux disease) 09/05/2016   Anemia, iron deficiency 09/05/2016   Hyperlipidemia 09/05/2016   Class 1 obesity with body mass index (BMI) of 31.0 to 31.9 in adult 09/05/2016   HYPERTENSION 08/20/2007   ABNORMAL HEART RHYTHMS 08/20/2007   COUGH 08/20/2007    [name], PT 07/27/20 9:23 AM  [facility] [address] [place], [place], [address] Phone: [phone]   Fax:  [phone]  Name: Cheyenne Walters MRN: [id number] Date of Birth: Sep 06, 1971

## 2020-07-28 ENCOUNTER — Ambulatory Visit: Payer: BC Managed Care – PPO

## 2020-08-11 ENCOUNTER — Other Ambulatory Visit: Payer: Self-pay

## 2020-08-11 ENCOUNTER — Ambulatory Visit: Payer: BC Managed Care – PPO

## 2020-08-11 DIAGNOSIS — M6281 Muscle weakness (generalized): Secondary | ICD-10-CM

## 2020-08-11 DIAGNOSIS — M25611 Stiffness of right shoulder, not elsewhere classified: Secondary | ICD-10-CM | POA: Diagnosis not present

## 2020-08-11 DIAGNOSIS — M25612 Stiffness of left shoulder, not elsewhere classified: Secondary | ICD-10-CM | POA: Diagnosis not present

## 2020-08-11 DIAGNOSIS — M542 Cervicalgia: Secondary | ICD-10-CM

## 2020-08-11 NOTE — Therapy (Signed)
[facility] [address] [place], [place], [address] Phone: [phone]   Fax:  [phone]  Physical Therapy Treatment  Patient Details  Name: Cheyenne Walters MRN: [id number] Date of Birth: 05-20-1971 Referring Provider (PT): Dr. Betty Martinique   Encounter Date: 08/11/2020   PT End of Session - 08/11/20 0925    Visit Number 13    Date for PT Re-Evaluation 09/23/20    Authorization Type BCBS 90 visit limit    PT Start Time 0846    PT Stop Time 0926    PT Time Calculation (min) 40 min    Activity Tolerance Patient tolerated treatment well    Behavior During Therapy [facility] for tasks assessed/performed           Past Medical History:  Diagnosis Date   Anemia    Bumps on skin    left lower abdomen   Chicken pox    GERD (gastroesophageal reflux disease)    Headache    Migraines   Irregular heart beat    Sarcoidosis     Past Surgical History:  Procedure Laterality Date   COLONOSCOPY     HYSTERECTOMY ABDOMINAL WITH SALPINGECTOMY Bilateral 05/15/2017   Procedure: HYSTERECTOMY ABDOMINAL WITH SALPINGECTOMY;  Surgeon: Princess Bruins, MD;  Location: [facility];  Service: Gynecology;  Laterality: Bilateral;  request to follow around 11:30am  requests 1 1/2 hours for the case.   TUBAL LIGATION      There were no vitals filed for this visit.   Subjective Assessment - 08/11/20 0848    Subjective I am feeling 70% better overall.  I've been doing my exercises.    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 Yes    Pain Score 0-No pain    Pain Location Shoulder    Pain Orientation Right    Pain Descriptors / Indicators Aching;Tightness    Pain Type Acute pain    Pain Onset More than a month ago    Pain Frequency Constant              [facility] PT Assessment - 08/11/20 0001      AROM   Cervical - Right  Rotation 70    Cervical - Left Rotation 65                         [facility] Adult PT Treatment/Exercise - 08/11/20 0001      Shoulder Exercises: Supine   Horizontal ABduction Strengthening;Both;Theraband;20 reps    Theraband Level (Shoulder Horizontal ABduction) Level 3 (Green)    Horizontal ABduction Limitations on foam roll    Theraband Level (Shoulder External Rotation) Level 3 (Green)    External Rotation Limitations on foam roll      Shoulder Exercises: Seated   Other Seated Exercises 3 way raises: 2# 2x10      Shoulder Exercises: ROM/Strengthening   UBE (Upper Arm Bike) Level 1 x 7 minutes    PT present to discuss progress and provide posture cues     Manual Therapy   Manual Therapy Joint mobilization;Soft tissue mobilization    Manual therapy comments cervical mobs into sidebending Lt, sideglide Rt and rotation both Rt and Lt with coupling                     PT Short Term Goals - 07/27/20 2683  PT SHORT TERM GOAL #2   Title The patient will report a 30% improvement in neck and bil shoulder pain with dressing, washing her back and reaching into cabinets    Baseline 30% reported    Status Achieved             PT Long Term Goals - 07/27/20 0853      PT LONG TERM GOAL #1   Title The patient will be independent in safe self progression of HEP    Time 8    Period Weeks    Status On-going      PT LONG TERM GOAL #2   Title The patient will report a 60% improvement in neck and shoulder pain with sleeping, bathing, dressing and cooking ADLS    Baseline 06%-30%    Status On-going      PT LONG TERM GOAL #6   Baseline has not driven this far    Period Weeks    Status On-going                 Plan - 08/11/20 0927    Clinical Impression Statement Pt reports an overall improvement in her original pain in the Lt arm and neck/thoracic spine and reports 70% overall reduction.  Pt reports that pain is aggravated by working and standing  long periods on concrete floors.   Pt demonstrated improved cervical A/ROM bilaterally with limitation to the Lt.  Pt with reduced segmental mobility in the cervical spine and demonstrated Pt required intermittent cueing for scapular relaxation with UE activity.  Pt will continue to benefit from skilled PT to address Lt UE strength and flexibility, cervical flexibility, and postural strength.    PT Frequency 1x / week    PT Duration 12 weeks    PT Treatment/Interventions ADLs/Self Care Home Management;Cryotherapy;Electrical Stimulation;Ultrasound;Traction;Moist Heat;Neuromuscular re-education;Therapeutic exercise;Therapeutic activities;Patient/family education;Manual techniques;Dry needling;Taping;Iontophoresis 4mg /ml Dexamethasone    PT Next Visit Plan Pt will return 08/24/20 after vacation, continue cervical mobility, postural strength and shoulder mobility    PT Home Exercise Plan [id number]    Consulted and Agree with Plan of Care Patient           Patient will benefit from skilled therapeutic intervention in order to improve the following deficits and impairments:  Decreased range of motion, Increased fascial restricitons, Pain, Impaired UE functional use, Impaired perceived functional ability, Decreased strength, Decreased activity tolerance  Visit Diagnosis: Cervicalgia  Stiffness of right shoulder, not elsewhere classified  Stiffness of left shoulder, not elsewhere classified  Muscle weakness (generalized)     Problem List Patient Active Problem List   Diagnosis Date Noted   Sarcoidosis 10/07/2018   Postoperative state 05/15/2017   GERD (gastroesophageal reflux disease) 09/05/2016   Anemia, iron deficiency 09/05/2016   Hyperlipidemia 09/05/2016   Class 1 obesity with body mass index (BMI) of 31.0 to 31.9 in adult 09/05/2016   HYPERTENSION 08/20/2007   ABNORMAL HEART RHYTHMS 08/20/2007   COUGH 08/20/2007   [name], PT 08/11/20 9:29 AM  [facility] [address], [place], [place], [address] Phone: [phone]   Fax:  [phone]  Name: Cheyenne Walters MRN: [id number] Date of Birth: [birth year]

## 2020-08-16 ENCOUNTER — Other Ambulatory Visit: Payer: Self-pay | Admitting: Family Medicine

## 2020-08-16 DIAGNOSIS — M5441 Lumbago with sciatica, right side: Secondary | ICD-10-CM

## 2020-08-16 DIAGNOSIS — M7541 Impingement syndrome of right shoulder: Secondary | ICD-10-CM

## 2020-08-24 ENCOUNTER — Other Ambulatory Visit: Payer: Self-pay

## 2020-08-24 ENCOUNTER — Ambulatory Visit: Payer: BC Managed Care – PPO

## 2020-08-24 DIAGNOSIS — M542 Cervicalgia: Secondary | ICD-10-CM | POA: Diagnosis not present

## 2020-08-24 DIAGNOSIS — M25611 Stiffness of right shoulder, not elsewhere classified: Secondary | ICD-10-CM | POA: Diagnosis not present

## 2020-08-24 DIAGNOSIS — M25612 Stiffness of left shoulder, not elsewhere classified: Secondary | ICD-10-CM

## 2020-08-24 DIAGNOSIS — M6281 Muscle weakness (generalized): Secondary | ICD-10-CM

## 2020-08-24 NOTE — Therapy (Signed)
[facility] [address] [place], [place], [address] Phone: [phone]   Fax:  [phone]  Physical Therapy Treatment  Patient Details  Name: Cheyenne Walters MRN: [id number] Date of Birth: Nov 28, 1970 Referring Provider (PT): Dr. Betty Martinique   Encounter Date: 08/24/2020   PT End of Session - 08/24/20 1042    Visit Number 14    Date for PT Re-Evaluation 09/23/20    Authorization Type BCBS 90 visit limit    PT Start Time 1015    PT Stop Time 1044    PT Time Calculation (min) 29 min    Activity Tolerance Patient tolerated treatment well    Behavior During Therapy [facility] for tasks assessed/performed           Past Medical History:  Diagnosis Date   Anemia    Bumps on skin    left lower abdomen   Chicken pox    GERD (gastroesophageal reflux disease)    Headache    Migraines   Irregular heart beat    Sarcoidosis     Past Surgical History:  Procedure Laterality Date   COLONOSCOPY     HYSTERECTOMY ABDOMINAL WITH SALPINGECTOMY Bilateral 05/15/2017   Procedure: HYSTERECTOMY ABDOMINAL WITH SALPINGECTOMY;  Surgeon: Princess Bruins, MD;  Location: [facility];  Service: Gynecology;  Laterality: Bilateral;  request to follow around 11:30am  requests 1 1/2 hours for the case.   TUBAL LIGATION      There were no vitals filed for this visit.   Subjective Assessment - 08/24/20 1023    Subjective I feel 80% better.  My neck felt much better after last session.    Currently in Pain? No/denies                             [facility] Adult PT Treatment/Exercise - 08/24/20 0001      Shoulder Exercises: Supine   Horizontal ABduction Strengthening;Both;Theraband;20 reps    Theraband Level (Shoulder Horizontal ABduction) Level 3 (Green)    Horizontal ABduction Limitations on foam roll    Theraband Level (Shoulder External Rotation) Level 3 (Green)    External Rotation Limitations on foam roll       Shoulder Exercises: Seated   Other Seated Exercises 3 way raises: 2# 2x10      Shoulder Exercises: Sidelying   Other Sidelying Exercises open book x10 right/left       Shoulder Exercises: ROM/Strengthening   UBE (Upper Arm Bike) Level 1 x 8 minutes    PT present to discuss progress and provide posture cues                   PT Short Term Goals - 07/27/20 6314      PT SHORT TERM GOAL #2   Title The patient will report a 30% improvement in neck and bil shoulder pain with dressing, washing her back and reaching into cabinets    Baseline 30% reported    Status Achieved             PT Long Term Goals - 08/24/20 1023      PT LONG TERM GOAL #1   Title The patient will be independent in safe self progression of HEP    Time 8    Period Weeks    Status On-going      PT LONG TERM GOAL #2   Title The patient will report a 60% improvement in neck  and shoulder pain with sleeping, bathing, dressing and cooking ADLS    Baseline 81% improvement    Status Achieved      PT LONG TERM GOAL #6   Title Patient will be able to drive for longer periods of time ([place]) without stopping    Baseline Lt sided low back pain- had to stop and stretch.  No neck or shoulder    Status On-going                 Plan - 08/24/20 1037    Clinical Impression Statement Pt reports 80% overall improvement since the start of care.  [place] and experienced Lt sided low back pain and no increase in neck or shoulder pain.  Pt had days off work last week and this seems to help to reduce her overall pain.  Pt returned to work yesterday and did well overall without significant increase in pain.  Pt requires tactile cues for scapular position with exercise today and demonstrated Lt shoulder fatigue and associated discomfort in the Lt shoulder with 2nd set of shoulder raises.  Pt will continue to benefit from skilled PT to address postural strength, cervical mobility, shoulder  pain and endurance.    Stability/Clinical Decision Making Unstable/Unpredictable    Rehab Potential Good    PT Frequency 1x / week    PT Duration 12 weeks    PT Treatment/Interventions ADLs/Self Care Home Management;Cryotherapy;Electrical Stimulation;Ultrasound;Traction;Moist Heat;Neuromuscular re-education;Therapeutic exercise;Therapeutic activities;Patient/family education;Manual techniques;Dry needling;Taping;Iontophoresis 4mg /ml Dexamethasone    PT Next Visit Plan postural strength, cervical mobility, shoulder strength and endurance    PT Home Exercise Plan [email or web address]    Recommended Other Services recert is signed    Consulted and Agree with Plan of Care Patient           Patient will benefit from skilled therapeutic intervention in order to improve the following deficits and impairments:  Decreased range of motion, Increased fascial restricitons, Pain, Impaired UE functional use, Impaired perceived functional ability, Decreased strength, Decreased activity tolerance  Visit Diagnosis: Stiffness of right shoulder, not elsewhere classified  Cervicalgia  Stiffness of left shoulder, not elsewhere classified  Muscle weakness (generalized)     Problem List Patient Active Problem List   Diagnosis Date Noted   Sarcoidosis 10/07/2018   Postoperative state 05/15/2017   GERD (gastroesophageal reflux disease) 09/05/2016   Anemia, iron deficiency 09/05/2016   Hyperlipidemia 09/05/2016   Class 1 obesity with body mass index (BMI) of 31.0 to 31.9 in adult 09/05/2016   HYPERTENSION 08/20/2007   ABNORMAL HEART RHYTHMS 08/20/2007   COUGH 08/20/2007   [name], PT 08/24/20 10:51 AM  [facility] [address] [place], [place], [address] Phone: [phone]   Fax:  [phone]  Name: Cheyenne Walters MRN: [id number] Date of Birth: 07/18/1971

## 2020-08-31 ENCOUNTER — Other Ambulatory Visit: Payer: Self-pay

## 2020-08-31 ENCOUNTER — Ambulatory Visit: Payer: BC Managed Care – PPO | Attending: Family Medicine | Admitting: Physical Therapy

## 2020-08-31 DIAGNOSIS — M542 Cervicalgia: Secondary | ICD-10-CM | POA: Diagnosis not present

## 2020-08-31 DIAGNOSIS — M6281 Muscle weakness (generalized): Secondary | ICD-10-CM | POA: Diagnosis not present

## 2020-08-31 DIAGNOSIS — M25611 Stiffness of right shoulder, not elsewhere classified: Secondary | ICD-10-CM | POA: Diagnosis not present

## 2020-08-31 DIAGNOSIS — M25612 Stiffness of left shoulder, not elsewhere classified: Secondary | ICD-10-CM | POA: Diagnosis not present

## 2020-08-31 NOTE — Patient Instructions (Signed)
Access Code: [email or web address] URL: [email or web address] Date: 08/31/2020 Prepared by: Ruben Im  Exercises Seated Cervical Rotation AROM - 4 x daily - 7 x weekly - 3 sets - 3 reps Seated Cervical Flexion AROM - 4 x daily - 7 x weekly - 3 sets - 3 reps Standing Cervical Sidebending AROM - 4 x daily - 7 x weekly - 3 sets - 3 reps Seated Scapular Retraction - 4 x daily - 7 x weekly - 3 sets - 3 reps doorway hip flexor stretch - 1 x daily - 7 x weekly - 3 sets - 5 reps Median Nerve Flossing - 1 x daily - 7 x weekly - 1 sets - 10 reps Standing Row with Anchored Resistance - 1 x daily - 7 x weekly - 2 sets - 10 reps Standing Shoulder Extension with Resistance - 1 x daily - 7 x weekly - 2 sets - 10 reps Seated Assisted Cervical Rotation with Towel - 1 x daily - 7 x weekly - 1 sets - 10 reps Wall Clock with Theraband - 1 x daily - 7 x weekly - 3 sets - 5 reps Standing Hip Hinge with Dowel - 1 x daily - 7 x weekly - 1 sets - 10 reps Half Dead Lift with Kettlebell - 1 x daily - 7 x weekly - 1 sets - 10 reps Standing Bicep Curls Supinated with Dumbbells - 1 x daily - 7 x weekly - 1 sets - 10 reps Push-Up on Counter - 1 x daily - 7 x weekly - 1 sets - 10 reps Cat-Camel - 1 x daily - 7 x weekly - 1 sets - 10 reps Quadruped Alternating Leg Extensions - 1 x daily - 7 x weekly - 1 sets - 10 reps Supine Bridge with Resistance Band - 1 x daily - 7 x weekly - 1 sets - 10 reps Hooklying Clamshell with Resistance - 1 x daily - 7 x weekly - 1 sets - 10 reps Standing Quadratus Lumborum Stretch with Doorway - 1 x daily - 7 x weekly - 1 sets - 5 reps  Patient Education Sleep Hygiene

## 2020-08-31 NOTE — Therapy (Signed)
[facility] [address] [place], [place], [address] Phone: [phone]   Fax:  [phone]  Physical Therapy Treatment  Patient Details  Name: Cheyenne Walters MRN: [id number] Date of Birth: 05-03-1971 Referring Provider (PT): Dr. Betty Martinique   Encounter Date: 08/31/2020   PT End of Session - 08/31/20 1307    Visit Number 15    Date for PT Re-Evaluation 09/23/20    Authorization Type BCBS 90 visit limit    PT Start Time 1015    PT Stop Time 1058    PT Time Calculation (min) 43 min    Activity Tolerance Patient tolerated treatment well           Past Medical History:  Diagnosis Date  . Anemia   . Bumps on skin    left lower abdomen  . Chicken pox   . GERD (gastroesophageal reflux disease)   . Headache    Migraines  . Irregular heart beat   . Sarcoidosis     Past Surgical History:  Procedure Laterality Date  . COLONOSCOPY    . HYSTERECTOMY ABDOMINAL WITH SALPINGECTOMY Bilateral 05/15/2017   Procedure: HYSTERECTOMY ABDOMINAL WITH SALPINGECTOMY;  Surgeon: Princess Bruins, MD;  Location: [facility];  Service: Gynecology;  Laterality: Bilateral;  request to follow around 11:30am  requests 1 1/2 hours for the case.  . TUBAL LIGATION      There were no vitals filed for this visit.   Subjective Assessment - 08/31/20 1016    Subjective My husband has a sinus injection and is affecting my sleep.  Everything is good except my low back is sore.  I feel it at work with bending and lifting.    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 get back to myself    Currently in Pain? Yes    Pain Score 5     Pain Location Back    Pain Orientation Left    Pain Descriptors / Indicators Sore                             [facility] Adult PT Treatment/Exercise - 08/31/20 0001      Neck Exercises:  Standing   Other Standing Exercises step ups 15x right/left       Lumbar Exercises: Stretches   Other Lumbar Stretch Exercise psoas doorway stretch 3x5 right/left       Lumbar Exercises: Supine   Clam 10 reps    Clam Limitations red band loop    Bridge 10 reps    Bridge Limitations red loop around knees     Other Supine Lumbar Exercises whole leg and opp arm push down 5x 5 sec hold     Other Supine Lumbar Exercises red loop hip flexion 5x each leg       Lumbar Exercises: Quadruped   Madcat/Old Horse 10 reps    Single Arm Raise Right;Left;5 reps      Shoulder Exercises: Standing   Other Standing Exercises green band shoulder extension with opp side hip extension red band 10x right/left       Shoulder Exercises: ROM/Strengthening   UBE (Upper Arm Bike) Level 1 x 6 minutes    PT present to discuss progress and provide posture cues     Shoulder Exercises: Stretch   Other Shoulder Stretches doorway stretch  with UE elevation and reach overs 3x5                   PT Education - 08/31/20 1303    Education Details cat camel; quadruped hip extension; bridge, supine clams    Person(s) Educated Patient    Methods Explanation;Demonstration;Handout    Comprehension Returned demonstration;Verbalized understanding            PT Short Term Goals - 07/27/20 0852      PT SHORT TERM GOAL #2   Title The patient will report a 30% improvement in neck and bil shoulder pain with dressing, washing her back and reaching into cabinets    Baseline 30% reported    Status Achieved             PT Long Term Goals - 08/31/20 1313      PT LONG TERM GOAL #1   Title The patient will be independent in safe self progression of HEP    Time 8    Period Weeks    Status On-going    Target Date 09/23/20      PT LONG TERM GOAL #2   Title The patient will report a 60% improvement in neck and shoulder pain with sleeping, bathing, dressing and cooking ADLS    Status Achieved      PT LONG TERM  GOAL #3   Title The patient will have grossly 4+/5 UE strength needed for lifting for work duties.    Time 12    Period Weeks    Status On-going      PT LONG TERM GOAL #4   Title Cervical ROM improved to 35 degrees flexion, 30 degrees extension and 30 degrees of sidebending needed for work duties    Status Achieved      PT LONG TERM GOAL #5   Time 12    Period Weeks    Status On-going      PT LONG TERM GOAL #6   Title Patient will be able to drive for longer periods of time ([place]) without stopping    Time 8    Period Weeks    Status On-going      PT LONG TERM GOAL #7   Title Bil shoulder flexion to 165 degrees bil for reaching overhead.    Time 8    Period Weeks    Status On-going                 Plan - 08/31/20 1049    Clinical Impression Statement The patient reports improving neck and shoulder pain today with her primary complaint of left low back pain and soreness.  She is able to perform some exercises which would also help her low back in addition to her neck and shoulder.  She needs some verbal and tactile cues in quadruped to avoid trunk rotation.  Cues for hand positioning to encourage increased activation of lats for shoulder and back muscle support.  Progressing well with rehab goals.    Examination-Activity Limitations Lift;Stand;Dressing;Sleep;Carry;Reach Overhead;Bathing    Examination-Participation Restrictions Meal Prep;Cleaning;Occupation;Driving;Laundry    Rehab Potential Good    PT Frequency 1x / week    PT Duration 12 weeks    PT Treatment/Interventions ADLs/Self Care Home Management;Cryotherapy;Electrical Stimulation;Ultrasound;Traction;Moist Heat;Neuromuscular re-education;Therapeutic exercise;Therapeutic activities;Patient/family education;Manual techniques;Dry needling;Taping;Iontophoresis 4mg /ml Dexamethasone    PT Next Visit Plan try supine thoracic extension on 1/2 foam roll with shoulder elevation;  postural strength, cervical  mobility, shoulder strength and endurance  PT Home Exercise Plan [email or web address]           Patient will benefit from skilled therapeutic intervention in order to improve the following deficits and impairments:  Decreased range of motion, Increased fascial restricitons, Pain, Impaired UE functional use, Impaired perceived functional ability, Decreased strength, Decreased activity tolerance  Visit Diagnosis: Stiffness of right shoulder, not elsewhere classified  Cervicalgia  Stiffness of left shoulder, not elsewhere classified  Muscle weakness (generalized)     Problem List Patient Active Problem List   Diagnosis Date Noted  . Sarcoidosis 10/07/2018  . Postoperative state 05/15/2017  . GERD (gastroesophageal reflux disease) 09/05/2016  . Anemia, iron deficiency 09/05/2016  . Hyperlipidemia 09/05/2016  . Class 1 obesity with body mass index (BMI) of 31.0 to 31.9 in adult 09/05/2016  . HYPERTENSION 08/20/2007  . ABNORMAL HEART RHYTHMS 08/20/2007  . COUGH 08/20/2007   Ruben Im, PT 08/31/20 1:14 PM Phone: [phone] Fax: [phone] Alvera Singh 08/31/2020, 1:14 PM  [facility] [address], [place], [place], [address] Phone: [phone]   Fax:  [phone]  Name: Cheyenne Walters MRN: [id number] Date of Birth: 11-03-1970

## 2020-09-07 ENCOUNTER — Ambulatory Visit: Payer: BC Managed Care – PPO | Admitting: Physical Therapy

## 2020-09-07 ENCOUNTER — Other Ambulatory Visit: Payer: Self-pay

## 2020-09-07 DIAGNOSIS — M542 Cervicalgia: Secondary | ICD-10-CM

## 2020-09-07 DIAGNOSIS — M25611 Stiffness of right shoulder, not elsewhere classified: Secondary | ICD-10-CM

## 2020-09-07 DIAGNOSIS — M6281 Muscle weakness (generalized): Secondary | ICD-10-CM

## 2020-09-07 DIAGNOSIS — M25612 Stiffness of left shoulder, not elsewhere classified: Secondary | ICD-10-CM | POA: Diagnosis not present

## 2020-09-07 NOTE — Therapy (Signed)
[facility] [address], [place], [place], [address] Phone: [phone]   Fax:  [phone]  Physical Therapy Treatment  Patient Details  Name: Cheyenne Walters MRN: [id number] Date of Birth: [birth year] Referring Provider (PT): Dr. Betty Martinique   Encounter Date: 09/07/2020   PT End of Session - 09/07/20 1059    Visit Number 16    Date for PT Re-Evaluation 09/23/20    Authorization Type BCBS 90 visit limit    PT Start Time 1015    PT Stop Time 1055    PT Time Calculation (min) 40 min    Activity Tolerance Patient tolerated treatment well           Past Medical History:  Diagnosis Date  . Anemia   . Bumps on skin    left lower abdomen  . Chicken pox   . GERD (gastroesophageal reflux disease)   . Headache    Migraines  . Irregular heart beat   . Sarcoidosis     Past Surgical History:  Procedure Laterality Date  . COLONOSCOPY    . HYSTERECTOMY ABDOMINAL WITH SALPINGECTOMY Bilateral 05/15/2017   Procedure: HYSTERECTOMY ABDOMINAL WITH SALPINGECTOMY;  Surgeon: Princess Bruins, MD;  Location: [facility];  Service: Gynecology;  Laterality: Bilateral;  request to follow around 11:30am  requests 1 1/2 hours for the case.  . TUBAL LIGATION      There were no vitals filed for this visit.   Subjective Assessment - 09/07/20 1020    Subjective My shoulder hurts when I lie on it.  Still hurts some this morning.  My low back is better.  Overall I'm 85% better.    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 tried to stand 5 hours but that bothered me    Currently in Pain? Yes    Pain Score 2     Pain Location Shoulder    Pain Orientation Left    Pain Type Acute pain                             [facility] Adult PT Treatment/Exercise - 09/07/20 0001      Shoulder Exercises: Seated    Other Seated Exercises thoracic extension with hands behind neck 10x      Shoulder Exercises: Prone   Horizontal ABduction 1 AROM;Both;10 reps    Horizontal ABduction 1 Limitations over targets T    Horizontal ABduction 2 Both;10 reps    Horizontal ABduction 2 Limitations over targets Y      Shoulder Exercises: Standing   Other Standing Exercises bow and arrow pulls green band 10x right/left      Shoulder Exercises: ROM/Strengthening   Other ROM/Strengthening Exercises green ball on wall 10x    Other ROM/Strengthening Exercises thoracic extension over 1/2 foam roll with head supported by hands 10x      Shoulder Exercises: Power Tower   Extension 10 reps    Extension Limitations 25#    Other Power Tower Exercises 20# pull back hands to ears 10x                  PT Education - 09/07/20 1058    Education Details prone horizontal abduction and Ys up and over targets; supine and seated thoracic extension    Person(s) Educated Patient  Methods Explanation;Demonstration;Handout    Comprehension Returned demonstration;Verbalized understanding            PT Short Term Goals - 07/27/20 0852      PT SHORT TERM GOAL #2   Title The patient will report a 30% improvement in neck and bil shoulder pain with dressing, washing her back and reaching into cabinets    Baseline 30% reported    Status Achieved             PT Long Term Goals - 08/31/20 1313      PT LONG TERM GOAL #1   Title The patient will be independent in safe self progression of HEP    Time 8    Period Weeks    Status On-going    Target Date 09/23/20      PT LONG TERM GOAL #2   Title The patient will report a 60% improvement in neck and shoulder pain with sleeping, bathing, dressing and cooking ADLS    Status Achieved      PT LONG TERM GOAL #3   Title The patient will have grossly 4+/5 UE strength needed for lifting for work duties.    Time 12    Period Weeks    Status On-going      PT LONG TERM  GOAL #4   Title Cervical ROM improved to 35 degrees flexion, 30 degrees extension and 30 degrees of sidebending needed for work duties    Status Achieved      PT LONG TERM GOAL #5   Time 12    Period Weeks    Status On-going      PT LONG TERM GOAL #6   Title Patient will be able to drive for longer periods of time ([place]) without stopping    Time 8    Period Weeks    Status On-going      PT LONG TERM GOAL #7   Title Bil shoulder flexion to 165 degrees bil for reaching overhead.    Time 8    Period Weeks    Status On-going                 Plan - 09/07/20 1028    Clinical Impression Statement The patient reports an overall improvement of 85%.  She reports less shoulder pain with daily function although still bothersome if she were to sleep on that side/on her shoulder.  She is able to progress strengthening of rotator cuff and middle and lower trap muscles with muscle fatigue but no pain reported.  She should meet the remaining goals by next visit and be ready for discharge to independent HEP.    Examination-Activity Limitations Lift;Stand;Dressing;Sleep;Carry;Reach Overhead;Bathing    Examination-Participation Restrictions Meal Prep;Cleaning;Occupation;Driving;Laundry    Rehab Potential Good    PT Frequency 1x / week    PT Duration 12 weeks    PT Treatment/Interventions ADLs/Self Care Home Management;Cryotherapy;Electrical Stimulation;Ultrasound;Traction;Moist Heat;Neuromuscular re-education;Therapeutic exercise;Therapeutic activities;Patient/family education;Manual techniques;Dry needling;Taping;Iontophoresis 4mg /ml Dexamethasone    PT Next Visit Plan recheck ROM, strength and remaining goals for probable discharge    PT Home Exercise Plan [email or web address]           Patient will benefit from skilled therapeutic intervention in order to improve the following deficits and impairments:  Decreased range of motion,Increased fascial restricitons,Pain,Impaired UE functional  use,Impaired perceived functional ability,Decreased strength,Decreased activity tolerance  Visit Diagnosis: Stiffness of right shoulder, not elsewhere classified  Cervicalgia  Stiffness of left shoulder, not elsewhere classified  Muscle weakness (  generalized)     Problem List Patient Active Problem List   Diagnosis Date Noted  . Sarcoidosis 10/07/2018  . Postoperative state 05/15/2017  . GERD (gastroesophageal reflux disease) 09/05/2016  . Anemia, iron deficiency 09/05/2016  . Hyperlipidemia 09/05/2016  . Class 1 obesity with body mass index (BMI) of 31.0 to 31.9 in adult 09/05/2016  . HYPERTENSION 08/20/2007  . ABNORMAL HEART RHYTHMS 08/20/2007  . COUGH 08/20/2007   Ruben Im, PT 09/07/20 9:15 PM Phone: [phone] Fax: [phone] Alvera Singh 09/07/2020, 9:15 PM  [facility] [address], [place], [place], [address] Phone: [phone]   Fax:  [phone]  Name: Cheyenne Walters MRN: [id number] Date of Birth: 1971-09-13

## 2020-09-07 NOTE — Patient Instructions (Signed)
Access Code: [email or web address] URL: [email or web address] Date: 09/07/2020 Prepared by: Ruben Im  Exercises Seated Cervical Rotation AROM - 4 x daily - 7 x weekly - 3 sets - 3 reps Seated Cervical Flexion AROM - 4 x daily - 7 x weekly - 3 sets - 3 reps Standing Cervical Sidebending AROM - 4 x daily - 7 x weekly - 3 sets - 3 reps Seated Scapular Retraction - 4 x daily - 7 x weekly - 3 sets - 3 reps doorway hip flexor stretch - 1 x daily - 7 x weekly - 3 sets - 5 reps Median Nerve Flossing - 1 x daily - 7 x weekly - 1 sets - 10 reps Standing Row with Anchored Resistance - 1 x daily - 7 x weekly - 2 sets - 10 reps Standing Shoulder Extension with Resistance - 1 x daily - 7 x weekly - 2 sets - 10 reps Seated Assisted Cervical Rotation with Towel - 1 x daily - 7 x weekly - 1 sets - 10 reps Wall Clock with Theraband - 1 x daily - 7 x weekly - 3 sets - 5 reps Standing Hip Hinge with Dowel - 1 x daily - 7 x weekly - 1 sets - 10 reps Half Dead Lift with Kettlebell - 1 x daily - 7 x weekly - 1 sets - 10 reps Standing Bicep Curls Supinated with Dumbbells - 1 x daily - 7 x weekly - 1 sets - 10 reps Push-Up on Counter - 1 x daily - 7 x weekly - 1 sets - 10 reps Cat-Camel - 1 x daily - 7 x weekly - 1 sets - 10 reps Quadruped Alternating Leg Extensions - 1 x daily - 7 x weekly - 1 sets - 10 reps Supine Bridge with Resistance Band - 1 x daily - 7 x weekly - 1 sets - 10 reps Hooklying Clamshell with Resistance - 1 x daily - 7 x weekly - 1 sets - 10 reps Standing Quadratus Lumborum Stretch with Doorway - 1 x daily - 7 x weekly - 1 sets - 5 reps Prone Shoulder Horizontal Abduction with Thumbs Up - 1 x daily - 7 x weekly - 1 sets - 10 reps Prone Scapular Retraction Y - 1 x daily - 7 x weekly - 1 sets - 10 reps Thoracic Extension Mobilization on Foam Roll - 1 x daily - 7 x weekly - 1 sets - 10 reps Seated Thoracic Lumbar Extension with Pectoralis Stretch - 1 x daily - 7 x weekly - 1 sets - 10  reps  Patient Education Sleep Hygiene

## 2020-09-14 ENCOUNTER — Other Ambulatory Visit: Payer: Self-pay

## 2020-09-14 ENCOUNTER — Ambulatory Visit: Payer: BC Managed Care – PPO | Admitting: Physical Therapy

## 2020-09-14 DIAGNOSIS — M25611 Stiffness of right shoulder, not elsewhere classified: Secondary | ICD-10-CM | POA: Diagnosis not present

## 2020-09-14 DIAGNOSIS — M6281 Muscle weakness (generalized): Secondary | ICD-10-CM | POA: Diagnosis not present

## 2020-09-14 DIAGNOSIS — M542 Cervicalgia: Secondary | ICD-10-CM | POA: Diagnosis not present

## 2020-09-14 DIAGNOSIS — M25612 Stiffness of left shoulder, not elsewhere classified: Secondary | ICD-10-CM | POA: Diagnosis not present

## 2020-09-14 NOTE — Therapy (Signed)
[facility] [address], [place], [place], [address] Phone: [phone]   Fax:  [phone]  Physical Therapy Treatment/Discharge Summary   Patient Details  Name: Cheyenne Walters MRN: [id number] Date of Birth: 10-14-1970 Referring Provider (PT): Dr. Betty Martinique   Encounter Date: 09/14/2020   PT End of Session - 09/14/20 1048    Visit Number 17    Date for PT Re-Evaluation 09/23/20    Authorization Type [employer] 90 visit limit    PT Start Time 1016    PT Stop Time 0623   Discharge visit    PT Time Calculation (min) 23 min    Activity Tolerance Patient tolerated treatment well           Past Medical History:  Diagnosis Date  . Anemia   . Bumps on skin    left lower abdomen  . Chicken pox   . GERD (gastroesophageal reflux disease)   . Headache    Migraines  . Irregular heart beat   . Sarcoidosis     Past Surgical History:  Procedure Laterality Date  . COLONOSCOPY    . HYSTERECTOMY ABDOMINAL WITH SALPINGECTOMY Bilateral 05/15/2017   Procedure: HYSTERECTOMY ABDOMINAL WITH SALPINGECTOMY;  Surgeon: Princess Bruins, MD;  Location: [facility];  Service: Gynecology;  Laterality: Bilateral;  request to follow around 11:30am  requests 1 1/2 hours for the case.  . TUBAL LIGATION      There were no vitals filed for this visit.   Subjective Assessment - 09/14/20 1018    Subjective Everything feeling much better.    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 tried to stand 5 hours but that bothered me    Patient Stated Goals get better. get back to myself    Currently in Pain? No/denies    Pain Score 0-No pain    Pain Location Shoulder    Pain Orientation Left              [facility] PT Assessment - 09/14/20 0001      Observation/Other Assessments   Focus on Therapeutic Outcomes (FOTO)  32%       AROM   Right Shoulder Flexion 148 Degrees    Right Shoulder ABduction 150 Degrees    Left Shoulder Flexion 150 Degrees    Left Shoulder ABduction 148 Degrees    Cervical Flexion 60    Cervical Extension 48    Cervical - Right Side Bend 45    Cervical - Left Side Bend 40    Cervical - Right Rotation 50    Cervical - Left Rotation 50      Strength   Overall Strength Comments 4+5 bil shoulders                         [facility] Adult PT Treatment/Exercise - 09/14/20 0001      Neck Exercises: Seated   Other Seated Exercise Review of HEP and discussion of safe self progression      Shoulder Exercises: Seated   Other Seated Exercises expected strength gains at 12 weeks                    PT Short Term Goals - 09/14/20 1037      PT SHORT TERM GOAL #1   Title The patient will demonstrate knowledge of  basic self care and HEP to promote healing    Status Achieved      PT SHORT TERM GOAL #2   Title The patient will report a 30% improvement in neck and bil shoulder pain with dressing, washing her back and reaching into cabinets    Status Achieved      PT SHORT TERM GOAL #3   Title The patient will have 150 degrees of bil shoulder flexion ROM needed to reach to above head level shelves    Status Achieved      PT SHORT TERM GOAL #4   Title Cervical rotation improved to 40 degrees bil needed for driving    Status Achieved             PT Long Term Goals - 09/14/20 1036      PT LONG TERM GOAL #1   Title The patient will be independent in safe self progression of HEP    Status Achieved      PT LONG TERM GOAL #2   Title The patient will report a 60% improvement in neck and shoulder pain with sleeping, bathing, dressing and cooking ADLS    Baseline 05% improvement    Status Achieved      PT LONG TERM GOAL #3   Title The patient will have grossly 4+/5 UE strength needed for lifting for work duties.    Status Achieved      PT LONG TERM GOAL #4   Title  Cervical ROM improved to 35 degrees flexion, 30 degrees extension and 30 degrees of sidebending needed for work duties    Status Achieved      PT LONG TERM GOAL #5   Title FOTO functional outcome score improved from 46% limitation to 28%    Status Partially Met      PT LONG TERM GOAL #6   Title Patient will be able to drive for longer periods of time ([place]) without stopping    Status Achieved      PT LONG TERM GOAL #7   Title Bil shoulder flexion to 165 degrees bil for reaching overhead.    Status Partially Met                 Plan - 09/14/20 1049    Clinical Impression Statement The patient reports an 85% improvement in symptoms since start of care.  Her cervical ROM is significantly improved in all directions.  Her bil shoulder ROM has improved as well particularly abduction.  Strength grossly 4+/5 and she should notice further gains over the next few weeks as she continues with her HEP.  Her FOTO functional outcome score has improved from 46% limitation to 32%.  She has met the majority of rehab goals and expresses readiness for discharge to a HEP.    Examination-Activity Limitations Lift;Stand;Dressing;Sleep;Carry;Reach Overhead;Bathing    Examination-Participation Restrictions Meal Prep;Cleaning;Occupation;Driving;Laundry    Stability/Clinical Decision Making Unstable/Unpredictable    Rehab Potential Good    PT Frequency 1x / week    PT Duration 12 weeks    PT Treatment/Interventions ADLs/Self Care Home Management;Cryotherapy;Electrical Stimulation;Ultrasound;Traction;Moist Heat;Neuromuscular re-education;Therapeutic exercise;Therapeutic activities;Patient/family education;Manual techniques;Dry needling;Taping;Iontophoresis 60m/ml Dexamethasone    PT Home Exercise Plan [id number]           Patient will benefit from skilled therapeutic intervention in order to improve the following deficits and impairments:  Decreased range of motion,Increased fascial  restricitons,Pain,Impaired UE functional use,Impaired perceived functional ability,Decreased strength,Decreased activity tolerance  Visit Diagnosis: Stiffness of right shoulder, not  elsewhere classified  Cervicalgia  Stiffness of left shoulder, not elsewhere classified  Muscle weakness (generalized)    PHYSICAL THERAPY DISCHARGE SUMMARY  Visits from Start of Care: 17  Current functional level related to goals / functional outcomes: See clinical impressions above   Remaining deficits: As above   Education / Equipment: HEP Plan: Patient agrees to discharge.  Patient goals were partially met. Patient is being discharged due to meeting the stated rehab goals.  ?????          Problem List Patient Active Problem List   Diagnosis Date Noted  . Sarcoidosis 10/07/2018  . Postoperative state 05/15/2017  . GERD (gastroesophageal reflux disease) 09/05/2016  . Anemia, iron deficiency 09/05/2016  . Hyperlipidemia 09/05/2016  . Class 1 obesity with body mass index (BMI) of 31.0 to 31.9 in adult 09/05/2016  . HYPERTENSION 08/20/2007  . ABNORMAL HEART RHYTHMS 08/20/2007  . COUGH 08/20/2007   Ruben Im, PT 09/14/20 10:55 AM Phone: [phone] Fax: [phone] Alvera Singh 09/14/2020, 10:54 AM  [facility] [address], [place], [place], [address] Phone: [phone]   Fax:  [phone]  Name: Cheyenne Walters MRN: [id number] Date of Birth: Feb 25, 1971

## 2020-10-04 DIAGNOSIS — Z9189 Other specified personal risk factors, not elsewhere classified: Secondary | ICD-10-CM | POA: Diagnosis not present

## 2020-10-04 DIAGNOSIS — Z20822 Contact with and (suspected) exposure to covid-19: Secondary | ICD-10-CM | POA: Diagnosis not present

## 2021-02-28 ENCOUNTER — Other Ambulatory Visit: Payer: Self-pay

## 2021-02-28 ENCOUNTER — Ambulatory Visit (HOSPITAL_COMMUNITY)
Admission: EM | Admit: 2021-02-28 | Discharge: 2021-02-28 | Disposition: A | Discharge status: Home / Self Care | Payer: BC Managed Care – PPO | Attending: Family Medicine | Admitting: Family Medicine

## 2021-02-28 ENCOUNTER — Encounter (HOSPITAL_COMMUNITY): Payer: Self-pay

## 2021-02-28 DIAGNOSIS — R1013 Epigastric pain: Secondary | ICD-10-CM

## 2021-02-28 MED ORDER — SUCRALFATE 1 G PO TABS
1.0000 g | ORAL_TABLET | Freq: Three times a day (TID) | ORAL | 0 refills | Status: DC
Start: 2021-02-28 — End: 2022-03-16

## 2021-02-28 MED ORDER — PANTOPRAZOLE SODIUM 20 MG PO TBEC
20.0000 mg | DELAYED_RELEASE_TABLET | Freq: Every day | ORAL | 0 refills | Status: DC
Start: 2021-02-28 — End: 2022-03-13

## 2021-02-28 NOTE — Discharge Instructions (Addendum)
You have been seen today for abdominal pain. Your evaluation was not suggestive of any emergent condition requiring medical intervention at this time. However, some abdominal problems make take more time to appear. Therefore, it is very important for you to pay attention to any new symptoms or worsening of your current condition.  Your ECG (heart tracing) did not show any worrisome abnormalities.  Please return here or to the Emergency Department immediately should you begin to feel worse in any way or have any of the following symptoms: increasing or different abdominal pain, persistent vomiting, inability to drink fluids, fevers, or shaking chills.

## 2021-02-28 NOTE — ED Triage Notes (Signed)
Pt presents with intermittent epigastric pain and nausea X 2 weeks; pt states sometimes when she burps she gets some relief.

## 2021-02-28 NOTE — ED Provider Notes (Signed)
[place]   [id number] 02/28/21 Arrival Time: 5621  ASSESSMENT & PLAN:  1. Epigastric pain   2. Dyspepsia    ECG interpreted by me: NSR; no STEMI.  Benign abdominal exam. No indications for urgent abdominal/pelvic imaging at this time. Discussed.  Begin trial of: Meds ordered this encounter  Medications  . pantoprazole (PROTONIX) 20 MG tablet    Sig: Take 1 tablet (20 mg total) by mouth daily.    Dispense:  30 tablet    Refill:  0  . sucralfate (CARAFATE) 1 g tablet    Sig: Take 1 tablet (1 g total) by mouth 4 (four) times daily -  with meals and at bedtime.    Dispense:  28 tablet    Refill:  0     Discharge Instructions     You have been seen today for abdominal pain. Your evaluation was not suggestive of any emergent condition requiring medical intervention at this time. However, some abdominal problems make take more time to appear. Therefore, it is very important for you to pay attention to any new symptoms or worsening of your current condition.  Your ECG (heart tracing) did not show any worrisome abnormalities.  Please return here or to the Emergency Department immediately should you begin to feel worse in any way or have any of the following symptoms: increasing or different abdominal pain, persistent vomiting, inability to drink fluids, fevers, or shaking chills.       Follow-up Information    Martinique, Betty G, MD.   Specialty: Family Medicine Why: Keep your shcduled follow up appointment. Contact information: [place] [address] [phone]        [place].   Specialty: Emergency Medicine Why: If symptoms worsen in any way. Contact information: [address] [id number] [place] [phone]              Reviewed expectations re: course of current medical issues. Questions answered. Outlined signs and symptoms indicating need for  more acute intervention. Patient verbalized understanding. After Visit Summary given.   SUBJECTIVE: History from: patient. Cheyenne Walters is a 50 y.o. female who presents with complaint of epigastric/lower chest pain; intermittent lasting until she can belch which relieves discomfort. Occas wakes her at night. Afebrile. Wt stable. Normal appetite. No specific lower abd pain. Normal bowel/bladder habits. Does feel discomfort is worse post-prandial most times. OTC Tums with mild relief.  Patient's last menstrual period was 04/30/2017 (exact date).   Past Surgical History:  Procedure Laterality Date  . COLONOSCOPY    . HYSTERECTOMY ABDOMINAL WITH SALPINGECTOMY Bilateral 05/15/2017   Procedure: HYSTERECTOMY ABDOMINAL WITH SALPINGECTOMY;  Surgeon: Princess Bruins, MD;  Location: [facility];  Service: Gynecology;  Laterality: Bilateral;  request to follow around 11:30am  requests 1 1/2 hours for the case.  . TUBAL LIGATION      OBJECTIVE:  Vitals:   02/28/21 0907  BP: 117/75  Pulse: 82  Resp: 17  Temp: 97.9 F (36.6 C)  TempSrc: Oral  SpO2: 100%    General appearance: alert, oriented, no acute distress HEENT: [place]; AT; oropharynx moist Lungs: unlabored respirations CV: reg Abdomen: soft; without distention; mild  and poorly localized tenderness to palpation over epigastric abdomen; normal bowel sounds; without masses or organomegaly; without guarding or rebound tenderness Back: without reported CVA tenderness; FROM at waist Extremities: without LE edema; symmetrical; without gross deformities Skin: warm and dry Neurologic: normal gait Psychological: alert  and cooperative; normal mood and affect  No Known Allergies                                             Past Medical History:  Diagnosis Date  . Anemia   . Bumps on skin    left lower abdomen  . Chicken pox   . GERD (gastroesophageal reflux disease)   . Headache    Migraines  . Irregular heart beat   . Sarcoidosis      Social History   Socioeconomic History  . Marital status: Married    Spouse name: Not on file  . Number of children: Not on file  . Years of education: Not on file  . Highest education level: Not on file  Occupational History  . Not on file  Tobacco Use  . Smoking status: Never Smoker  . Smokeless tobacco: Never Used  Vaping Use  . Vaping Use: Never used  Substance and Sexual Activity  . Alcohol use: Yes    Comment: OCC  . Drug use: No  . Sexual activity: Yes    Partners: Male  Other Topics Concern  . Not on file  Social History Narrative  . Not on file   Social Determinants of Health   Financial Resource Strain: Not on file  Food Insecurity: Not on file  Transportation Needs: Not on file  Physical Activity: Not on file  Stress: Not on file  Social Connections: Not on file  Intimate Partner Violence: Not on file    Family History  Problem Relation Age of Onset  . Alcohol abuse Father   . Hypertension Maternal Grandmother   . Diabetes Maternal Grandmother   . Hypertension Maternal Grandfather   . Diabetes Sister   . Cancer Neg Hx        colon or gyn     Vanessa Kick, MD 02/28/21 [phone]

## 2021-03-11 ENCOUNTER — Encounter: Payer: Self-pay | Admitting: Family Medicine

## 2021-03-11 ENCOUNTER — Encounter: Payer: BC Managed Care – PPO | Admitting: Family Medicine

## 2021-03-11 ENCOUNTER — Other Ambulatory Visit: Payer: Self-pay

## 2021-03-11 ENCOUNTER — Ambulatory Visit (INDEPENDENT_AMBULATORY_CARE_PROVIDER_SITE_OTHER): Payer: BC Managed Care – PPO | Admitting: Family Medicine

## 2021-03-11 VITALS — BP 126/80 | HR 88 | Temp 98.6°F | Resp 16 | Ht 59.0 in | Wt 166.0 lb

## 2021-03-11 DIAGNOSIS — E785 Hyperlipidemia, unspecified: Secondary | ICD-10-CM | POA: Diagnosis not present

## 2021-03-11 DIAGNOSIS — Z1329 Encounter for screening for other suspected endocrine disorder: Secondary | ICD-10-CM

## 2021-03-11 DIAGNOSIS — R1032 Left lower quadrant pain: Secondary | ICD-10-CM

## 2021-03-11 DIAGNOSIS — Z0001 Encounter for general adult medical examination with abnormal findings: Secondary | ICD-10-CM | POA: Diagnosis not present

## 2021-03-11 DIAGNOSIS — Z130 Encounter for screening for diseases of the blood and blood-forming organs and certain disorders involving the immune mechanism: Secondary | ICD-10-CM

## 2021-03-11 DIAGNOSIS — Z1159 Encounter for screening for other viral diseases: Secondary | ICD-10-CM

## 2021-03-11 DIAGNOSIS — Z13228 Encounter for screening for other metabolic disorders: Secondary | ICD-10-CM

## 2021-03-11 NOTE — Patient Instructions (Signed)
Today you have you routine preventive visit. A few things to remember from today's visit:   Encounter for general adult medical examination with abnormal findings  LLQ abdominal pain - Plan: Urinalysis, Routine w reflex microscopic, CBC  Hyperlipidemia, unspecified hyperlipidemia type - Plan: Lipid panel  Screening for endocrine, metabolic and immunity disorder - Plan: Basic metabolic panel, Hemoglobin A1c  Encounter for HCV screening test for low risk patient - Plan: Hepatitis C antibody  If you need refills please call your pharmacy. Do not use My Chart to request refills or for acute issues that need immediate attention.   Please be sure medication list is accurate. If a new problem present, please set up appointment sooner than planned today.  At least 150 minutes of moderate exercise per week, daily brisk walking for 15-30 min is a good exercise option. Healthy diet low in saturated (animal) fats and sweets and consisting of fresh fruits and vegetables, lean meats such as fish and white chicken and whole grains.  These are some of recommendations for screening depending of age and risk factors:  - Vaccines:  Tdap vaccine every 10 years.  Shingles vaccine recommended at age 63, could be given after 52 years of age but not sure about insurance coverage.   Pneumonia vaccines: Pneumovax at 91. Sometimes Pneumovax is giving earlier if history of smoking, lung disease,diabetes,kidney disease among some.  Screening for diabetes at age 86 and every 3 years.  Cervical cancer prevention:  Pap smear starts at 52 years of age and continues periodically until 52 years old in low risk women. Pap smear every 3 years between 76 and 93 years old. Pap smear every 3-5 years between women 78 and older if pap smear negative and HPV screening negative. Hysterectomy.   -Breast cancer: Mammogram: There is disagreement between experts about when to start screening in low risk asymptomatic  female but recent recommendations are to start screening at 57 and not later than 52 years old , every 1-2 years and after 52 yo q 2 years. Screening is recommended until 52 years old but some women can continue screening depending of healthy issues.  Colon cancer screening: Has been recently changed to 52 yo. Insurance may not cover until you are 52 years old. Screening is recommended until 52 years old.  Cholesterol disorder screening at age 60 and every 3 years.  Also recommended:  Dental visit- Brush and floss your teeth twice daily; visit your dentist twice a year. Eye doctor- Get an eye exam at least every 2 years. Helmet use- Always wear a helmet when riding a bicycle, motorcycle, rollerblading or skateboarding. Safe sex- If you may be exposed to sexually transmitted infections, use a condom. Seat belts- Seat belts can save your live; always wear one. Smoke/Carbon Monoxide detectors- These detectors need to be installed on the appropriate level of your home. Replace batteries at least once a year. Skin cancer- When out in the sun please cover up and use sunscreen 15 SPF or higher. Violence- If anyone is threatening or hurting you, please tell your healthcare provider.  Drink alcohol in moderation- Limit alcohol intake to one drink or less per day. Never drink and drive. Calcium supplementation 1000 to 1200 mg daily, ideally through your diet.  Vitamin D supplementation 800 units daily.

## 2021-03-11 NOTE — Progress Notes (Addendum)
HPI: [name] is a 50 y.o. female, who is here today for her routine physical.  Last CPE: 10/07/18.  Regular exercise 3 or more time per week: Walking at least 3 times per week for about 30-45 min. Following a healthy diet: During summer time she eats more salads. She is cooking at home more often. She lives with her husband and 2 children.  Chronic medical problems: Chronic cervicalgia,OA,sarcoidosis,HLD,and GERD among some.  Pap smear:S/P hysterectomy. She follows with gyn regularly, Dr Cheyenne Walters.  Immunization History  Administered Date(s) Administered   Tdap 05/07/2013   Mammogram: 03/11/20, she has an appt. Colonoscopy: I do not have copy of report but reviewing records (04/18/16) she had her last colonoscopy 08/2014 with Dr Cheyenne Walters, no polyps + diverticulosis. Repeat in 10 years.  DEXA: N/A Hep C screening: Never.  Concerns today:  LLQ pain that started when walking yesterday. No hx of trauma. No prior Hx. No changes in bowel habits or urinary symptoms. "Nagging pain."  Negative for fever or changes in appetite. She has not taken OTC medication. HLD: Not on pharmacologic treatment. Last FLP 10/07/18 TC 214,LDL 145,HDL 48,and TG 102.  Review of Systems  Constitutional:  Negative for activity change and fatigue.  HENT:  Negative for hearing loss, mouth sores, trouble swallowing and voice change.   Eyes:  Negative for redness and visual disturbance.  Respiratory:  Negative for cough, shortness of breath and wheezing.   Cardiovascular:  Negative for chest pain and leg swelling.  Gastrointestinal:  Negative for abdominal distention, anal bleeding, nausea and vomiting.  Endocrine: Negative for cold intolerance, heat intolerance, polydipsia, polyphagia and polyuria.  Genitourinary:  Negative for decreased urine volume, dysuria, hematuria, vaginal bleeding and vaginal discharge.  Musculoskeletal:  Negative for gait problem and myalgias.  Skin:  Negative for color  change and rash.  Neurological:  Negative for syncope, weakness and headaches.  Hematological:  Negative for adenopathy. Does not bruise/bleed easily.  Psychiatric/Behavioral:  Negative for confusion. The patient is not nervous/anxious.   All other systems reviewed and are negative.  Current Outpatient Medications on File Prior to Visit  Medication Sig Dispense Refill   acetaminophen (TYLENOL) 325 MG tablet Take 2 tablets (650 mg total) by mouth every 6 (six) hours as needed. 30 tablet 0   celecoxib (CELEBREX) 100 MG capsule TAKE 1 CAPSULE BY MOUTH 2 TIMES DAILY AS NEEDED. 60 capsule 0   Multiple Vitamins-Minerals (MULTIPLE VITAMINS/WOMENS PO) Take 2 tablets by mouth daily. Gummy Vitamins     pantoprazole (PROTONIX) 20 MG tablet Take 1 tablet (20 mg total) by mouth daily. 30 tablet 0   sucralfate (CARAFATE) 1 g tablet Take 1 tablet (1 g total) by mouth 4 (four) times daily -  with meals and at bedtime. 28 tablet 0   vitamin C (ASCORBIC ACID) 500 MG tablet Take 500 mg by mouth daily.     No current facility-administered medications on file prior to visit.   Past Medical History:  Diagnosis Date   Anemia    Bumps on skin    left lower abdomen   Chicken pox    GERD (gastroesophageal reflux disease)    Headache    Migraines   Irregular heart beat    Sarcoidosis    Past Surgical History:  Procedure Laterality Date   COLONOSCOPY     HYSTERECTOMY ABDOMINAL WITH SALPINGECTOMY Bilateral 05/15/2017   Procedure: HYSTERECTOMY ABDOMINAL WITH SALPINGECTOMY;  Surgeon: Princess Bruins, MD;  Location: [facility];  Service: Gynecology;  Laterality: Bilateral;  request to follow around 11:30am  requests 1 1/2 hours for the case.   TUBAL LIGATION     No Known Allergies  Family History  Problem Relation Age of Onset   Alcohol abuse Father    Hypertension Maternal Grandmother    Diabetes Maternal Grandmother    Hypertension Maternal Grandfather    Diabetes Sister    Cancer Neg Hx        colon  or gyn    Social History   Socioeconomic History   Marital status: Married    Spouse name: Not on file   Number of children: Not on file   Years of education: Not on file   Highest education level: Not on file  Occupational History   Not on file  Tobacco Use   Smoking status: Never   Smokeless tobacco: Never  Vaping Use   Vaping Use: Never used  Substance and Sexual Activity   Alcohol use: Yes    Comment: OCC   Drug use: No   Sexual activity: Yes    Partners: Male  Other Topics Concern   Not on file  Social History Narrative   Not on file   Social Determinants of Health   Financial Resource Strain: Not on file  Food Insecurity: Not on file  Transportation Needs: Not on file  Physical Activity: Not on file  Stress: Not on file  Social Connections: Not on file   Vitals:   03/11/21 1204  BP: 126/80  Pulse: 88  Resp: 16  Temp: 98.6 F (37 C)  SpO2: 96%   Body mass index is 33.53 kg/m.  Wt Readings from Last 3 Encounters:  03/11/21 166 lb (75.3 kg)  07/21/20 171 lb 8 oz (77.8 kg)  08/13/19 172 lb 3.2 oz (78.1 kg)   Physical Exam Vitals and nursing note reviewed.  Constitutional:      General: She is not in acute distress.    Appearance: She is well-developed.  HENT:     Head: Normocephalic and atraumatic.     Right Ear: Hearing, tympanic membrane, ear canal and external ear normal.     Left Ear: Hearing, tympanic membrane, ear canal and external ear normal.     Mouth/Throat:     Mouth: Mucous membranes are moist.     Pharynx: Oropharynx is clear. Uvula midline.  Eyes:     Extraocular Movements: Extraocular movements intact.     Conjunctiva/sclera: Conjunctivae normal.     Pupils: Pupils are equal, round, and reactive to light.  Neck:     Thyroid: No thyromegaly.     Trachea: No tracheal deviation.  Cardiovascular:     Rate and Rhythm: Normal rate and regular rhythm.     Pulses:          Dorsalis pedis pulses are 2+ on the right side and 2+ on  the left side.     Heart sounds: No murmur heard. Pulmonary:     Effort: Pulmonary effort is normal. No respiratory distress.     Breath sounds: Normal breath sounds.  Chest:  Breasts:    Right: No supraclavicular adenopathy.     Left: No supraclavicular adenopathy.  Abdominal:     General: There is no distension.     Palpations: Abdomen is soft. There is no hepatomegaly or mass.     Tenderness: There is abdominal tenderness in the left upper quadrant and left lower quadrant. There is no guarding or rebound.  Genitourinary:  Comments: Deferred to gyn. Musculoskeletal:     Comments: No signs of synovitis appreciated.  Lymphadenopathy:     Cervical: No cervical adenopathy.     Upper Body:     Right upper body: No supraclavicular adenopathy.     Left upper body: No supraclavicular adenopathy.  Skin:    General: Skin is warm.     Findings: No erythema or rash.  Neurological:     General: No focal deficit present.     Mental Status: She is alert and oriented to person, place, and time.     Cranial Nerves: No cranial nerve deficit.     Coordination: Coordination normal.     Gait: Gait normal.     Deep Tendon Reflexes:     Reflex Scores:      Bicep reflexes are 2+ on the right side and 2+ on the left side.      Patellar reflexes are 2+ on the right side and 2+ on the left side. Psychiatric:        Speech: Speech normal.     Comments: Well groomed, good eye contact.   ASSESSMENT AND PLAN:  Ms. Cheyenne Walters was here today annual physical examination.  Diagnoses and all orders for this visit: Orders Placed This Encounter  Procedures   Basic metabolic panel   Hepatitis C antibody   Lipid panel   Urinalysis, Routine w reflex microscopic   Hemoglobin A1c   CBC   Lab Results  Component Value Date   CHOL 222 (H) 03/14/2021   HDL 50.30 03/14/2021   LDLCALC 152 (H) 03/14/2021   TRIG 98.0 03/14/2021   CHOLHDL 4 03/14/2021   Lab Results  Component Value Date    CREATININE 0.65 03/14/2021   BUN 14 03/14/2021   NA 139 03/14/2021   K 4.1 03/14/2021   CL 105 03/14/2021   CO2 28 03/14/2021   Lab Results  Component Value Date   HGBA1C 5.9 03/14/2021    Encounter for general adult medical examination with abnormal findings We discussed the importance of regular physical activity and healthy diet for prevention of chronic illness and/or complications. Preventive guidelines reviewed. Vaccination up to date. Continue her female care with her gyn. Colonoscopy due in 08/2024, Ca++ and vit D supplementation recommended. Next CPE in a year.  The 10-year ASCVD risk score Mikey Bussing DC Brooke Bonito., et al., 2013) is: 2.5%   Values used to calculate the score:     Age: 6 years     Sex: Female     Is Non-Hispanic African American: Yes     Diabetic: No     Tobacco smoker: No     Systolic Blood Pressure: 176 mmHg     Is BP treated: No     HDL Cholesterol: 50.3 mg/dL     Total Cholesterol: 222 mg/dL  LLQ abdominal pain Hx and examination today do not suggest a serious process. I do not think imaging is needed at this time. Abdominal/pelvic CT in 09/2009 done for LLQ abdominal pain: Moderate amount of feces through colon. ? Constipation. Adequate fiber and fluid intake. Monitor for new symptoms. Instructed about warning signs.  Hyperlipidemia, unspecified hyperlipidemia type Low fat diet recommended for now. Further recommendations according to lab results.  Screening for endocrine, metabolic and immunity disorder -     Basic metabolic panel; Future -     Hemoglobin A1c; Future  Encounter for HCV screening test for low risk patient -     Hepatitis C antibody;  Future  Return in 1 year (on 03/11/2022) for CPE, before if needed..  Keller Bounds G. Martin[place], MD  [facility]. [place] office.   Today you have you routine preventive visit. A few things to remember from today's visit:   Encounter for general adult medical examination with abnormal  findings  LLQ abdominal pain - Plan: Urinalysis, Routine w reflex microscopic, CBC  Hyperlipidemia, unspecified hyperlipidemia type - Plan: Lipid panel  Screening for endocrine, metabolic and immunity disorder - Plan: Basic metabolic panel, Hemoglobin A1c  Encounter for HCV screening test for low risk patient - Plan: Hepatitis C antibody  If you need refills please call your pharmacy. Do not use My Chart to request refills or for acute issues that need immediate attention.   Please be sure medication list is accurate. If a new problem present, please set up appointment sooner than planned today.  At least 150 minutes of moderate exercise per week, daily brisk walking for 15-30 min is a good exercise option. Healthy diet low in saturated (animal) fats and sweets and consisting of fresh fruits and vegetables, lean meats such as fish and white chicken and whole grains.  These are some of recommendations for screening depending of age and risk factors:  - Vaccines:  Tdap vaccine every 10 years.  Shingles vaccine recommended at age 66, could be given after 52 years of age but not sure about insurance coverage.   Pneumonia vaccines: Pneumovax at 53. Sometimes Pneumovax is giving earlier if history of smoking, lung disease,diabetes,kidney disease among some.  Screening for diabetes at age 44 and every 3 years.  Cervical cancer prevention:  Pap smear starts at 52 years of age and continues periodically until 52 years old in low risk women. Pap smear every 3 years between 63 and 36 years old. Pap smear every 3-5 years between women 101 and older if pap smear negative and HPV screening negative. Hysterectomy.   -Breast cancer: Mammogram: There is disagreement between experts about when to start screening in low risk asymptomatic female but recent recommendations are to start screening at 11 and not later than 52 years old , every 1-2 years and after 51 yo q 2 years. Screening is  recommended until 52 years old but some women can continue screening depending of healthy issues.  Colon cancer screening: Has been recently changed to 51 yo. Insurance may not cover until you are 52 years old. Screening is recommended until 52 years old.  Cholesterol disorder screening at age 108 and every 3 years.  Also recommended:  Dental visit- Brush and floss your teeth twice daily; visit your dentist twice a year. Eye doctor- Get an eye exam at least every 2 years. Helmet use- Always wear a helmet when riding a bicycle, motorcycle, rollerblading or skateboarding. Safe sex- If you may be exposed to sexually transmitted infections, use a condom. Seat belts- Seat belts can save your live; always wear one. Smoke/Carbon Monoxide detectors- These detectors need to be installed on the appropriate level of your home. Replace batteries at least once a year. Skin cancer- When out in the sun please cover up and use sunscreen 15 SPF or higher. Violence- If anyone is threatening or hurting you, please tell your healthcare provider.  Drink alcohol in moderation- Limit alcohol intake to one drink or less per day. Never drink and drive. Calcium supplementation 1000 to 1200 mg daily, ideally through your diet.  Vitamin D supplementation 800 units daily.

## 2021-03-13 ENCOUNTER — Encounter: Payer: Self-pay | Admitting: Family Medicine

## 2021-03-14 ENCOUNTER — Other Ambulatory Visit (INDEPENDENT_AMBULATORY_CARE_PROVIDER_SITE_OTHER): Payer: BC Managed Care – PPO

## 2021-03-14 ENCOUNTER — Other Ambulatory Visit: Payer: Self-pay

## 2021-03-14 DIAGNOSIS — Z1159 Encounter for screening for other viral diseases: Secondary | ICD-10-CM | POA: Diagnosis not present

## 2021-03-14 DIAGNOSIS — Z13228 Encounter for screening for other metabolic disorders: Secondary | ICD-10-CM

## 2021-03-14 DIAGNOSIS — E785 Hyperlipidemia, unspecified: Secondary | ICD-10-CM

## 2021-03-14 DIAGNOSIS — Z1329 Encounter for screening for other suspected endocrine disorder: Secondary | ICD-10-CM | POA: Diagnosis not present

## 2021-03-14 DIAGNOSIS — Z130 Encounter for screening for diseases of the blood and blood-forming organs and certain disorders involving the immune mechanism: Secondary | ICD-10-CM

## 2021-03-14 DIAGNOSIS — R1032 Left lower quadrant pain: Secondary | ICD-10-CM | POA: Diagnosis not present

## 2021-03-14 LAB — CBC
HCT: 38.8 % (ref 36.0–46.0)
Hemoglobin: 13.2 g/dL (ref 12.0–15.0)
MCHC: 33.9 g/dL (ref 30.0–36.0)
MCV: 86.7 fl (ref 78.0–100.0)
Platelets: 179 10*3/uL (ref 150.0–400.0)
RBC: 4.48 Mil/uL (ref 3.87–5.11)
RDW: 13.6 % (ref 11.5–15.5)
WBC: 4.1 10*3/uL (ref 4.0–10.5)

## 2021-03-14 LAB — LIPID PANEL
Cholesterol: 222 mg/dL — ABNORMAL HIGH (ref 0–200)
HDL: 50.3 mg/dL (ref >39.00)
LDL Cholesterol: 152 mg/dL — ABNORMAL HIGH (ref 0–99)
NonHDL: 171.34
Total CHOL/HDL Ratio: 4
Triglycerides: 98 mg/dL (ref 0.0–149.0)
VLDL: 19.6 mg/dL (ref 0.0–40.0)

## 2021-03-14 LAB — BASIC METABOLIC PANEL
BUN: 14 mg/dL (ref 6–23)
CO2: 28 mEq/L (ref 19–32)
Calcium: 8.9 mg/dL (ref 8.4–10.5)
Chloride: 105 mEq/L (ref 96–112)
Creatinine, Ser: 0.65 mg/dL (ref 0.40–1.20)
GFR: 101.58 mL/min (ref >60.00)
Glucose, Bld: 96 mg/dL (ref 70–99)
Potassium: 4.1 mEq/L (ref 3.5–5.1)
Sodium: 139 mEq/L (ref 135–145)

## 2021-03-14 LAB — URINALYSIS, ROUTINE W REFLEX MICROSCOPIC
Bilirubin Urine: NEGATIVE
Hgb urine dipstick: NEGATIVE
Ketones, ur: NEGATIVE
Leukocytes,Ua: NEGATIVE
Nitrite: NEGATIVE
RBC / HPF: NONE SEEN (ref 0–?)
Specific Gravity, Urine: 1.025 (ref 1.000–1.030)
Total Protein, Urine: NEGATIVE
Urine Glucose: NEGATIVE
Urobilinogen, UA: 0.2 (ref 0.0–1.0)
pH: 5.5 (ref 5.0–8.0)

## 2021-03-14 LAB — HEMOGLOBIN A1C: Hgb A1c MFr Bld: 5.9 % (ref 4.6–6.5)

## 2021-03-15 LAB — HEPATITIS C ANTIBODY
Hepatitis C Ab: NONREACTIVE
SIGNAL TO CUT-OFF: 0.01 (ref <1.00)

## 2021-03-25 ENCOUNTER — Other Ambulatory Visit: Payer: Self-pay | Admitting: Family Medicine

## 2021-03-25 DIAGNOSIS — Z1231 Encounter for screening mammogram for malignant neoplasm of breast: Secondary | ICD-10-CM

## 2021-05-19 ENCOUNTER — Other Ambulatory Visit: Payer: Self-pay

## 2021-05-19 ENCOUNTER — Ambulatory Visit
Admission: RE | Admit: 2021-05-19 | Discharge: 2021-05-19 | Disposition: A | Discharge status: Home / Self Care | Payer: BC Managed Care – PPO | Source: Ambulatory Visit | Attending: Family Medicine | Admitting: Family Medicine

## 2021-05-19 DIAGNOSIS — Z1231 Encounter for screening mammogram for malignant neoplasm of breast: Secondary | ICD-10-CM | POA: Diagnosis not present

## 2021-06-28 ENCOUNTER — Other Ambulatory Visit: Payer: Self-pay

## 2021-06-29 ENCOUNTER — Ambulatory Visit (INDEPENDENT_AMBULATORY_CARE_PROVIDER_SITE_OTHER): Payer: BC Managed Care – PPO | Admitting: Family Medicine

## 2021-06-29 VITALS — BP 124/74 | HR 100 | Temp 98.5°F | Wt 167.1 lb

## 2021-06-29 DIAGNOSIS — J069 Acute upper respiratory infection, unspecified: Secondary | ICD-10-CM

## 2021-06-29 MED ORDER — BENZONATATE 100 MG PO CAPS
100.0000 mg | ORAL_CAPSULE | Freq: Three times a day (TID) | ORAL | 0 refills | Status: DC | PRN
Start: 2021-06-29 — End: 2022-03-13

## 2021-06-29 NOTE — Progress Notes (Signed)
Established Patient Office Visit  Subjective:  Patient ID: Cheyenne Walters, female    DOB: 30-Apr-1971  Age: 50 y.o. MRN: [id number]  CC:  Chief Complaint  Patient presents with  . Cough    Started sat, voice comes and goes, occasional cough, worse at night, can feel mucus caught in her throat but unable to cough it up.    HPI Cheyenne Walters presents for upper respiratory symptoms.  Onset Saturday.  Main symptom is cough.  Mostly dry.  She is had some intermittent laryngitis.  No fever.  Home COVID test negative.  No sick contacts.  No body aches.  Denies any sore throat.  No nausea, vomiting, or diarrhea.  No dyspnea.  Generally very healthy.  Rarely gets sick.  Past Medical History:  Diagnosis Date  . Anemia   . Bumps on skin    left lower abdomen  . Chicken pox   . GERD (gastroesophageal reflux disease)   . Headache    Migraines  . Irregular heart beat   . Sarcoidosis     Past Surgical History:  Procedure Laterality Date  . COLONOSCOPY    . HYSTERECTOMY ABDOMINAL WITH SALPINGECTOMY Bilateral 05/15/2017   Procedure: HYSTERECTOMY ABDOMINAL WITH SALPINGECTOMY;  Surgeon: Princess Bruins, MD;  Location: [facility];  Service: Gynecology;  Laterality: Bilateral;  request to follow around 11:30am  requests 1 1/2 hours for the case.  . TUBAL LIGATION      Family History  Problem Relation Age of Onset  . Alcohol abuse Father   . Hypertension Maternal Grandmother   . Diabetes Maternal Grandmother   . Hypertension Maternal Grandfather   . Diabetes Sister   . Cancer Neg Hx        colon or gyn    Social History   Socioeconomic History  . Marital status: Married    Spouse name: Not on file  . Number of children: Not on file  . Years of education: Not on file  . Highest education level: Not on file  Occupational History  . Not on file  Tobacco Use  . Smoking status: Never  . Smokeless tobacco: Never  Vaping Use  . Vaping Use: Never used  Substance and Sexual  Activity  . Alcohol use: Yes    Comment: OCC  . Drug use: No  . Sexual activity: Yes    Partners: Male  Other Topics Concern  . Not on file  Social History Narrative  . Not on file   Social Determinants of Health   Financial Resource Strain: Not on file  Food Insecurity: Not on file  Transportation Needs: Not on file  Physical Activity: Not on file  Stress: Not on file  Social Connections: Not on file  Intimate Partner Violence: Not on file    Outpatient Medications Prior to Visit  Medication Sig Dispense Refill  . acetaminophen (TYLENOL) 325 MG tablet Take 2 tablets (650 mg total) by mouth every 6 (six) hours as needed. 30 tablet 0  . celecoxib (CELEBREX) 100 MG capsule TAKE 1 CAPSULE BY MOUTH 2 TIMES DAILY AS NEEDED. 60 capsule 0  . Multiple Vitamins-Minerals (MULTIPLE VITAMINS/WOMENS PO) Take 2 tablets by mouth daily. Gummy Vitamins    . pantoprazole (PROTONIX) 20 MG tablet Take 1 tablet (20 mg total) by mouth daily. 30 tablet 0  . sucralfate (CARAFATE) 1 g tablet Take 1 tablet (1 g total) by mouth 4 (four) times daily -  with meals and at bedtime. 28 tablet 0  .  vitamin C (ASCORBIC ACID) 500 MG tablet Take 500 mg by mouth daily.     No facility-administered medications prior to visit.    No Known Allergies  ROS Review of Systems  Constitutional:  Negative for chills and fever.  HENT:  Positive for congestion and voice change. Negative for sinus pressure, sinus pain and sore throat.   Respiratory:  Positive for cough. Negative for shortness of breath and wheezing.      Objective:    Physical Exam Vitals reviewed.  Constitutional:      Appearance: Normal appearance.  HENT:     Right Ear: Tympanic membrane normal.     Left Ear: Tympanic membrane normal.  Cardiovascular:     Rate and Rhythm: Normal rate and regular rhythm.  Pulmonary:     Effort: Pulmonary effort is normal.     Breath sounds: Normal breath sounds. No wheezing or rales.  Musculoskeletal:      Cervical back: Neck supple.  Lymphadenopathy:     Cervical: No cervical adenopathy.  Neurological:     Mental Status: She is alert.    BP 124/74 (BP Location: Left Arm, Patient Position: Sitting, Cuff Size: Normal)   Pulse 100   Temp 98.5 F (36.9 C) (Oral)   Wt 167 lb 1.6 oz (75.8 kg)   LMP 04/30/2017 (Exact Date)   SpO2 96%   BMI 33.75 kg/m  Wt Readings from Last 3 Encounters:  06/29/21 167 lb 1.6 oz (75.8 kg)  03/11/21 166 lb (75.3 kg)  07/21/20 171 lb 8 oz (77.8 kg)     Health Maintenance Due  Topic Date Due  . Zoster Vaccines- Shingrix (1 of 2) Never done  . COVID-19 Vaccine (2 - Pfizer series) 10/28/2020  . INFLUENZA VACCINE  Never done    There are no preventive care reminders to display for this patient.  Lab Results  Component Value Date   TSH 1.04 10/07/2018   Lab Results  Component Value Date   WBC 4.1 03/14/2021   HGB 13.2 03/14/2021   HCT 38.8 03/14/2021   MCV 86.7 03/14/2021   PLT 179.0 03/14/2021   Lab Results  Component Value Date   NA 139 03/14/2021   K 4.1 03/14/2021   CO2 28 03/14/2021   GLUCOSE 96 03/14/2021   BUN 14 03/14/2021   CREATININE 0.65 03/14/2021   BILITOT 0.5 10/07/2018   ALKPHOS 91 10/07/2018   AST 13 10/07/2018   ALT 5 10/07/2018   PROT 6.9 10/07/2018   ALBUMIN 3.9 10/07/2018   CALCIUM 8.9 03/14/2021   GFR 101.58 03/14/2021   Lab Results  Component Value Date   CHOL 222 (H) 03/14/2021   Lab Results  Component Value Date   HDL 50.30 03/14/2021   Lab Results  Component Value Date   LDLCALC 152 (H) 03/14/2021   Lab Results  Component Value Date   TRIG 98.0 03/14/2021   Lab Results  Component Value Date   CHOLHDL 4 03/14/2021   Lab Results  Component Value Date   HGBA1C 5.9 03/14/2021      Assessment & Plan:   Problem List Items Addressed This Visit   None Visit Diagnoses     Viral URI with cough    -  Primary     Nonfocal exam.  No clear indication for antibiotics this time.  Tessalon Perles  100 mg every 8 hours as needed for cough.  Follow-up promptly for any fever or any persistent or worsening symptoms.  Meds ordered this encounter  Medications  . benzonatate (TESSALON PERLES) 100 MG capsule    Sig: Take 1 capsule (100 mg total) by mouth 3 (three) times daily as needed for cough.    Dispense:  30 capsule    Refill:  0    Follow-up: No follow-ups on file.    Carolann Littler, MD

## 2021-07-01 ENCOUNTER — Telehealth: Payer: Self-pay

## 2021-07-01 MED ORDER — HYDROCODONE BIT-HOMATROP MBR 5-1.5 MG/5ML PO SOLN: 5.0000 mL | Freq: Four times a day (QID) | ORAL | 0 refills | Status: DC | PRN

## 2021-07-01 NOTE — Telephone Encounter (Signed)
Patient called stating she had an appt eariler in the week with Dr. Elease Hashimoto and was told that if symptoms don't get better to call back to inform Dr. Abbott Pao also asked if a cough medication could be sent to the pharmacy

## 2021-07-01 NOTE — Telephone Encounter (Signed)
Called patient to make aware that Hycodan cough syrup has been sent to [facility] on [address], also it may cause sedation

## 2021-07-01 NOTE — Addendum Note (Signed)
Addended by: Eulas Post on: 07/01/2021 09:51 AM   Modules accepted: Orders

## 2021-07-01 NOTE — Telephone Encounter (Signed)
I sent a prescription for Hycodan cough syrup.  Make sure she is aware this can cause some sedation.

## 2021-10-20 ENCOUNTER — Telehealth: Payer: Self-pay | Admitting: Family Medicine

## 2021-10-20 ENCOUNTER — Encounter: Payer: Self-pay | Admitting: Adult Health

## 2021-10-20 ENCOUNTER — Ambulatory Visit (INDEPENDENT_AMBULATORY_CARE_PROVIDER_SITE_OTHER): Payer: BC Managed Care – PPO | Admitting: Adult Health

## 2021-10-20 VITALS — BP 130/80 | HR 100 | Temp 98.6°F | Wt 168.6 lb

## 2021-10-20 DIAGNOSIS — R432 Parageusia: Secondary | ICD-10-CM | POA: Diagnosis not present

## 2021-10-20 NOTE — Progress Notes (Signed)
Subjective:    Patient ID: Cheyenne Walters, female    DOB: 07-02-1971, 51 y.o.   MRN: [id number]  HPI  51 year old female who  has a past medical history of Anemia, Bumps on skin, Chicken pox, GERD (gastroesophageal reflux disease), Headache, Irregular heart beat, and Sarcoidosis.  She randomly lost the sensation of taste within the last week.  She woke up 1 morning and no longer taste anything.  She denies feeling ill at the time, took a COVID test which was negative.  Has no issues with smell.  He does not take any medications except for turmeric supplement.  Does not work around metal.  No trauma.  No headaches or neurological deficits noted  Review of Systems See HPI   Past Medical History:  Diagnosis Date   Anemia    Bumps on skin    left lower abdomen   Chicken pox    GERD (gastroesophageal reflux disease)    Headache    Migraines   Irregular heart beat    Sarcoidosis     Social History   Socioeconomic History   Marital status: Married    Spouse name: Not on file   Number of children: Not on file   Years of education: Not on file   Highest education level: Not on file  Occupational History   Not on file  Tobacco Use   Smoking status: Never   Smokeless tobacco: Never  Vaping Use   Vaping Use: Never used  Substance and Sexual Activity   Alcohol use: Yes    Comment: OCC   Drug use: No   Sexual activity: Yes    Partners: Male  Other Topics Concern   Not on file  Social History Narrative   Not on file   Social Determinants of Health   Financial Resource Strain: Not on file  Food Insecurity: Not on file  Transportation Needs: Not on file  Physical Activity: Not on file  Stress: Not on file  Social Connections: Not on file  Intimate Partner Violence: Not on file    Past Surgical History:  Procedure Laterality Date   COLONOSCOPY     HYSTERECTOMY ABDOMINAL WITH SALPINGECTOMY Bilateral 05/15/2017   Procedure: HYSTERECTOMY ABDOMINAL WITH  SALPINGECTOMY;  Surgeon: Princess Bruins, MD;  Location: [facility];  Service: Gynecology;  Laterality: Bilateral;  request to follow around 11:30am  requests 1 1/2 hours for the case.   TUBAL LIGATION      Family History  Problem Relation Age of Onset   Alcohol abuse Father    Hypertension Maternal Grandmother    Diabetes Maternal Grandmother    Hypertension Maternal Grandfather    Diabetes Sister    Cancer Neg Hx        colon or gyn    No Known Allergies  Current Outpatient Medications on File Prior to Visit  Medication Sig Dispense Refill   acetaminophen (TYLENOL) 325 MG tablet Take 2 tablets (650 mg total) by mouth every 6 (six) hours as needed. 30 tablet 0   benzonatate (TESSALON PERLES) 100 MG capsule Take 1 capsule (100 mg total) by mouth 3 (three) times daily as needed for cough. 30 capsule 0   celecoxib (CELEBREX) 100 MG capsule TAKE 1 CAPSULE BY MOUTH 2 TIMES DAILY AS NEEDED. 60 capsule 0   HYDROcodone bit-homatropine (HYCODAN) 5-1.5 MG/5ML syrup Take 5 mLs by mouth every 6 (six) hours as needed for cough. 120 mL 0   Multiple Vitamins-Minerals (MULTIPLE VITAMINS/WOMENS PO) Take  2 tablets by mouth daily. Gummy Vitamins     pantoprazole (PROTONIX) 20 MG tablet Take 1 tablet (20 mg total) by mouth daily. 30 tablet 0   sucralfate (CARAFATE) 1 g tablet Take 1 tablet (1 g total) by mouth 4 (four) times daily -  with meals and at bedtime. 28 tablet 0   vitamin C (ASCORBIC ACID) 500 MG tablet Take 500 mg by mouth daily.     No current facility-administered medications on file prior to visit.    BP 130/80 (BP Location: Left Arm, Patient Position: Sitting, Cuff Size: Normal)    Pulse 100    Temp 98.6 F (37 C) (Oral)    Wt 168 lb 9.6 oz (76.5 kg)    LMP 04/30/2017 (Exact Date)    SpO2 100%    BMI 34.05 kg/m       Objective:   Physical Exam Vitals and nursing note reviewed.  Constitutional:      Appearance: Normal appearance. She is obese.  HENT:     Right Ear: Tympanic  membrane, ear canal and external ear normal. There is no impacted cerumen.     Left Ear: Tympanic membrane, ear canal and external ear normal. There is no impacted cerumen.     Nose: Nose normal. No congestion or rhinorrhea.     Mouth/Throat:     Mouth: Mucous membranes are moist.     Pharynx: Oropharynx is clear. No oropharyngeal exudate or posterior oropharyngeal erythema.  Eyes:     General: No visual field deficit or scleral icterus.       Right eye: No discharge.        Left eye: No discharge.     Extraocular Movements: Extraocular movements intact.     Conjunctiva/sclera: Conjunctivae normal.     Pupils: Pupils are equal, round, and reactive to light.  Cardiovascular:     Rate and Rhythm: Normal rate and regular rhythm.     Pulses: Normal pulses.     Heart sounds: Normal heart sounds.  Pulmonary:     Effort: Pulmonary effort is normal.     Breath sounds: Normal breath sounds.  Musculoskeletal:        General: Normal range of motion.  Skin:    General: Skin is warm and dry.  Neurological:     General: No focal deficit present.     Mental Status: She is alert and oriented to person, place, and time.     Cranial Nerves: Cranial nerves 2-12 are intact. No cranial nerve deficit, dysarthria or facial asymmetry.     Sensory: Sensation is intact.     Motor: Motor function is intact.     Coordination: Coordination is intact.  Psychiatric:        Mood and Affect: Mood normal.        Behavior: Behavior normal.        Thought Content: Thought content normal.        Judgment: Judgment normal.      Assessment & Plan:  1. Loss of taste -Unknown cause.  Will check labs.  Consider referral to ear nose and throat or rheumatology. - CBC with Differential/Platelet; Future - Sedimentation Rate; Future - Comprehensive metabolic panel; Future - ANA; Future - TSH; Future - Vitamin B12; Future - Zinc; Future - Zinc - Vitamin B12 - TSH - ANA - Comprehensive metabolic panel -  Sedimentation Rate - CBC with Differential/Platelet  Dorothyann Peng, NP

## 2021-10-20 NOTE — Telephone Encounter (Signed)
Patient calling in with respiratory symptoms: Shortness of breath, chest pain, palpitations or other red words send to Triage  Does the patient have a fever over 100, cough, congestion, sore throat, runny nose, lost of taste/smell (please list symptoms that patient has)? Loss of taste  What date did symptoms start?10-13-2021 (If over 5 days ago, pt may be scheduled for in person visit)  Have you tested for Covid in the last 5 days? Yes   If yes, was it positive []  OR negative [x] ? If positive in the last 5 days, please schedule virtual visit now. If negative, schedule for an in person OV with the next available provider if PCP has no openings. Please also let patient know they will be tested again (follow the script below)  "you will have to arrive 41mins prior to your appt time to be Covid tested. Please park in back of office at the [facility] & call [phone] to let the staff know you have arrived. A staff member will meet you at your car to do a rapid covid test. Once the test has resulted you will be notified by phone of your results to determine if appt will remain an in person visit or be converted to a virtual/phone visit. If you arrive less than 61mins before your appt time, your visit will be automatically converted to virtual & any recommended testing will happen AFTER the visit." Pt has an appt with cory 10-20-2021 at 330pm  [place]  If no availability for virtual visit in office,  please schedule another [place] office  If no availability at another [place] office, please instruct patient that they can schedule an evisit or virtual visit through their mychart account. Visits up to 8pm  patients can be seen in office 5 days after positive COVID test

## 2021-10-20 NOTE — Patient Instructions (Signed)
It was great seeing you today   We will follow up with you regarding your lab work

## 2021-10-21 LAB — CBC WITH DIFFERENTIAL/PLATELET
Basophils Absolute: 0 10*3/uL (ref 0.0–0.1)
Basophils Relative: 0.5 % (ref 0.0–3.0)
Eosinophils Absolute: 0 10*3/uL (ref 0.0–0.7)
Eosinophils Relative: 0.7 % (ref 0.0–5.0)
HCT: 38.5 % (ref 36.0–46.0)
Hemoglobin: 12.9 g/dL (ref 12.0–15.0)
Lymphocytes Relative: 16 % (ref 12.0–46.0)
Lymphs Abs: 0.7 10*3/uL (ref 0.7–4.0)
MCHC: 33.4 g/dL (ref 30.0–36.0)
MCV: 87.5 fl (ref 78.0–100.0)
Monocytes Absolute: 0.4 10*3/uL (ref 0.1–1.0)
Monocytes Relative: 9.3 % (ref 3.0–12.0)
Neutro Abs: 3 10*3/uL (ref 1.4–7.7)
Neutrophils Relative %: 73.5 % (ref 43.0–77.0)
Platelets: 203 10*3/uL (ref 150.0–400.0)
RBC: 4.4 Mil/uL (ref 3.87–5.11)
RDW: 13.9 % (ref 11.5–15.5)
WBC: 4.1 10*3/uL (ref 4.0–10.5)

## 2021-10-21 LAB — COMPREHENSIVE METABOLIC PANEL
ALT: 5 U/L (ref 0–35)
AST: 17 U/L (ref 0–37)
Albumin: 3.9 g/dL (ref 3.5–5.2)
Alkaline Phosphatase: 78 U/L (ref 39–117)
BUN: 11 mg/dL (ref 6–23)
CO2: 29 mEq/L (ref 19–32)
Calcium: 9.2 mg/dL (ref 8.4–10.5)
Chloride: 104 mEq/L (ref 96–112)
Creatinine, Ser: 0.71 mg/dL (ref 0.40–1.20)
GFR: 97.68 mL/min (ref >60.00)
Glucose, Bld: 84 mg/dL (ref 70–99)
Potassium: 3.9 mEq/L (ref 3.5–5.1)
Sodium: 139 mEq/L (ref 135–145)
Total Bilirubin: 0.4 mg/dL (ref 0.2–1.2)
Total Protein: 7.4 g/dL (ref 6.0–8.3)

## 2021-10-21 LAB — TSH: TSH: 1.39 u[IU]/mL (ref 0.35–5.50)

## 2021-10-21 LAB — VITAMIN B12: Vitamin B-12: 272 pg/mL (ref 211–911)

## 2021-10-21 LAB — SEDIMENTATION RATE: Sed Rate: 56 mm/hr — ABNORMAL HIGH (ref 0–30)

## 2021-10-23 LAB — ZINC: Zinc: 62 ug/dL (ref 60–130)

## 2021-10-23 LAB — ANA: Anti Nuclear Antibody (ANA): NEGATIVE

## 2021-10-24 ENCOUNTER — Telehealth: Payer: Self-pay | Admitting: Family Medicine

## 2021-10-24 NOTE — Telephone Encounter (Signed)
Lm for pt to call back. Called to advised that a provider has not resulted her labs yet. Advise pt when return call that someone will call her when labs are resulted.

## 2021-10-24 NOTE — Telephone Encounter (Signed)
Pt saw cory on 10-20-2021 and would like someone to call her back to go over the blood work results pt does not understand the results

## 2021-10-26 NOTE — Telephone Encounter (Signed)
Patient notified of update  and verbalized understanding. 

## 2022-03-13 ENCOUNTER — Encounter: Payer: Self-pay | Admitting: Family Medicine

## 2022-03-13 ENCOUNTER — Ambulatory Visit (INDEPENDENT_AMBULATORY_CARE_PROVIDER_SITE_OTHER): Payer: BC Managed Care – PPO | Admitting: Family Medicine

## 2022-03-13 VITALS — BP 120/80 | HR 90 | Temp 98.4°F | Resp 16 | Ht 59.0 in | Wt 171.2 lb

## 2022-03-13 DIAGNOSIS — Z0000 Encounter for general adult medical examination without abnormal findings: Secondary | ICD-10-CM | POA: Diagnosis not present

## 2022-03-13 DIAGNOSIS — E785 Hyperlipidemia, unspecified: Secondary | ICD-10-CM

## 2022-03-13 DIAGNOSIS — Z130 Encounter for screening for diseases of the blood and blood-forming organs and certain disorders involving the immune mechanism: Secondary | ICD-10-CM | POA: Diagnosis not present

## 2022-03-13 DIAGNOSIS — Z13228 Encounter for screening for other metabolic disorders: Secondary | ICD-10-CM

## 2022-03-13 DIAGNOSIS — R079 Chest pain, unspecified: Secondary | ICD-10-CM | POA: Diagnosis not present

## 2022-03-13 DIAGNOSIS — Z1329 Encounter for screening for other suspected endocrine disorder: Secondary | ICD-10-CM | POA: Diagnosis not present

## 2022-03-13 DIAGNOSIS — K219 Gastro-esophageal reflux disease without esophagitis: Secondary | ICD-10-CM | POA: Diagnosis not present

## 2022-03-13 LAB — LIPID PANEL
Cholesterol: 220 mg/dL — ABNORMAL HIGH (ref 0–200)
HDL: 52.7 mg/dL (ref >39.00)
LDL Cholesterol: 145 mg/dL — ABNORMAL HIGH (ref 0–99)
NonHDL: 167.11
Total CHOL/HDL Ratio: 4
Triglycerides: 113 mg/dL (ref 0.0–149.0)
VLDL: 22.6 mg/dL (ref 0.0–40.0)

## 2022-03-13 LAB — BASIC METABOLIC PANEL
BUN: 15 mg/dL (ref 6–23)
CO2: 29 mEq/L (ref 19–32)
Calcium: 8.9 mg/dL (ref 8.4–10.5)
Chloride: 104 mEq/L (ref 96–112)
Creatinine, Ser: 0.71 mg/dL (ref 0.40–1.20)
GFR: 97.41 mL/min (ref >60.00)
Glucose, Bld: 90 mg/dL (ref 70–99)
Potassium: 3.8 mEq/L (ref 3.5–5.1)
Sodium: 138 mEq/L (ref 135–145)

## 2022-03-13 LAB — HEMOGLOBIN A1C: Hgb A1c MFr Bld: 5.9 % (ref 4.6–6.5)

## 2022-03-13 MED ORDER — PANTOPRAZOLE SODIUM 20 MG PO TBEC: 20.0000 mg | DELAYED_RELEASE_TABLET | Freq: Every day | ORAL | 1 refills | Status: DC

## 2022-03-13 NOTE — Assessment & Plan Note (Addendum)
This could be contributing to chest discomfort. Recommend resuming Pantoprazole 20 mg, she does not want to take daily medication, she can take it daily prn. Stressed the importance of GERD precautions.

## 2022-03-13 NOTE — Patient Instructions (Addendum)
A few things to remember from today's visit:  Routine general medical examination at a health care facility  Hyperlipidemia, unspecified hyperlipidemia type - Plan: Lipid panel  Screening for endocrine, metabolic and immunity disorder - Plan: Hemoglobin S0F, Basic metabolic panel  Chest pain, unspecified type - Plan: EKG 12-Lead  Gastroesophageal reflux disease, unspecified whether esophagitis present - Plan: pantoprazole (PROTONIX) 20 MG tablet  If you need refills please call your pharmacy. Do not use My Chart to request refills or for acute issues that need immediate attention.   Please be sure medication list is accurate. If a new problem present, please set up appointment sooner than planned today.  Health Maintenance, Female Adopting a healthy lifestyle and getting preventive care are important in promoting health and wellness. Ask your health care provider about: The right schedule for you to have regular tests and exams. Things you can do on your own to prevent diseases and keep yourself healthy. What should I know about diet, weight, and exercise? Eat a healthy diet  Eat a diet that includes plenty of vegetables, fruits, low-fat dairy products, and lean protein. Do not eat a lot of foods that are high in solid fats, added sugars, or sodium. Maintain a healthy weight Body mass index (BMI) is used to identify weight problems. It estimates body fat based on height and weight. Your health care provider can help determine your BMI and help you achieve or maintain a healthy weight. Get regular exercise Get regular exercise. This is one of the most important things you can do for your health. Most adults should: Exercise for at least 150 minutes each week. The exercise should increase your heart rate and make you sweat (moderate-intensity exercise). Do strengthening exercises at least twice a week. This is in addition to the moderate-intensity exercise. Spend less time sitting.  Even light physical activity can be beneficial. Watch cholesterol and blood lipids Have your blood tested for lipids and cholesterol at 53 years of age, then have this test every 5 years. Have your cholesterol levels checked more often if: Your lipid or cholesterol levels are high. You are older than 53 years of age. You are at high risk for heart disease. What should I know about cancer screening? Depending on your health history and family history, you may need to have cancer screening at various ages. This may include screening for: Breast cancer. Cervical cancer. Colorectal cancer. Skin cancer. Lung cancer. What should I know about heart disease, diabetes, and high blood pressure? Blood pressure and heart disease High blood pressure causes heart disease and increases the risk of stroke. This is more likely to develop in people who have high blood pressure readings or are overweight. Have your blood pressure checked: Every 3-5 years if you are 2-39 years of age. Every year if you are 27 years old or older. Diabetes Have regular diabetes screenings. This checks your fasting blood sugar level. Have the screening done: Once every three years after age 48 if you are at a normal weight and have a low risk for diabetes. More often and at a younger age if you are overweight or have a high risk for diabetes. What should I know about preventing infection? Hepatitis B If you have a higher risk for hepatitis B, you should be screened for this virus. Talk with your health care provider to find out if you are at risk for hepatitis B infection. Hepatitis C Testing is recommended for: Everyone born from [birth year] through 1967. Anyone  with known risk factors for hepatitis C. Sexually transmitted infections (STIs) Get screened for STIs, including gonorrhea and chlamydia, if: You are sexually active and are younger than 53 years of age. You are older than 53 years of age and your health care provider  tells you that you are at risk for this type of infection. Your sexual activity has changed since you were last screened, and you are at increased risk for chlamydia or gonorrhea. Ask your health care provider if you are at risk. Ask your health care provider about whether you are at high risk for HIV. Your health care provider may recommend a prescription medicine to help prevent HIV infection. If you choose to take medicine to prevent HIV, you should first get tested for HIV. You should then be tested every 3 months for as long as you are taking the medicine. Pregnancy If you are about to stop having your period (premenopausal) and you may become pregnant, seek counseling before you get pregnant. Take 400 to 800 micrograms (mcg) of folic acid every day if you become pregnant. Ask for birth control (contraception) if you want to prevent pregnancy. Osteoporosis and menopause Osteoporosis is a disease in which the bones lose minerals and strength with aging. This can result in bone fractures. If you are 28 years old or older, or if you are at risk for osteoporosis and fractures, ask your health care provider if you should: Be screened for bone loss. Take a calcium or vitamin D supplement to lower your risk of fractures. Be given hormone replacement therapy (HRT) to treat symptoms of menopause. Follow these instructions at home: Alcohol use Do not drink alcohol if: Your health care provider tells you not to drink. You are pregnant, may be pregnant, or are planning to become pregnant. If you drink alcohol: Limit how much you have to: 0-1 drink a day. Know how much alcohol is in your drink. In the [place] one drink equals one 12 oz bottle of beer (355 mL), one 5 oz glass of wine (148 mL), or one 1 oz glass of hard liquor (44 mL). Lifestyle Do not use any products that contain nicotine or tobacco. These products include cigarettes, chewing tobacco, and vaping devices, such as e-cigarettes. If you  need help quitting, ask your health care provider. Do not use street drugs. Do not share needles. Ask your health care provider for help if you need support or information about quitting drugs. General instructions Schedule regular health, dental, and eye exams. Stay current with your vaccines. Tell your health care provider if: You often feel depressed. You have ever been abused or do not feel safe at home. Summary Adopting a healthy lifestyle and getting preventive care are important in promoting health and wellness. Follow your health care provider's instructions about healthy diet, exercising, and getting tested or screened for diseases. Follow your health care provider's instructions on monitoring your cholesterol and blood pressure. This information is not intended to replace advice given to you by your health care provider. Make sure you discuss any questions you have with your health care provider. Document Revised: 01/31/2021 Document Reviewed: 01/31/2021 Elsevier Patient Education  [employer].

## 2022-03-13 NOTE — Assessment & Plan Note (Signed)
Non pharmacologic treatment recommended for now. Further recommendations will be given according to 10 years CVD risk score and lipid panel numbers. 

## 2022-03-13 NOTE — Progress Notes (Unsigned)
HPI: [name] is a 51 y.o. female, who is here today for her routine physical.  Last CPE: 03/11/21  Regular exercise 3 or more time per week: *** Following a healthy diet: ***  Chronic medical problems: HTN,GERD,HLD,and sarcoidosis among some.  Immunization History  Administered Date(s) Administered   Tdap 05/07/2013   Health Maintenance  Topic Date Due   Zoster Vaccines- Shingrix (1 of 2) Never done   COLONOSCOPY (Pts 45-13yr Insurance coverage will need to be confirmed)  03/13/2022 (Originally 04/08/2014)   COVID-19 Vaccine (1) 03/29/2022 (Originally 10/09/1971)   HIV Screening  03/14/2023 (Originally 04/08/1984)   INFLUENZA VACCINE  04/25/2022   TETANUS/TDAP  05/08/2023   MAMMOGRAM  05/20/2023   Hepatitis C Screening  Completed   HPV VACCINES  Aged Out   PAP SMEAR-Modifier  Discontinued   Her gyn retired. *** She has *** concerns today. HLD on non pharmacologic treatment.  Lab Results  Component Value Date   CHOL 222 (H) 03/14/2021   HDL 50.30 03/14/2021   LDLCALC 152 (H) 03/14/2021   TRIG 98.0 03/14/2021   CHOLHDL 4 03/14/2021   Pineapple juice and lemonade. *** Pantoprazole helped. *** Negative for exertional CP,palpitations,or SOB.  Review of Systems  Constitutional:  Negative for appetite change and fever.  HENT:  Negative for hearing loss, mouth sores, sore throat, trouble swallowing and voice change.   Eyes:  Negative for redness and visual disturbance.  Respiratory:  Negative for cough, shortness of breath and wheezing.   Cardiovascular:  Negative for chest pain and leg swelling.  Gastrointestinal:  Negative for abdominal pain, nausea and vomiting.       No changes in bowel habits.  Endocrine: Negative for cold intolerance, heat intolerance, polydipsia, polyphagia and polyuria.  Genitourinary:  Negative for decreased urine volume, dysuria, hematuria, vaginal bleeding and vaginal discharge.  Musculoskeletal:  Negative for arthralgias,  gait problem and myalgias.  Skin:  Negative for color change and rash.  Allergic/Immunologic: Negative for environmental allergies.  Neurological:  Negative for syncope, weakness and headaches.  Hematological:  Negative for adenopathy. Does not bruise/bleed easily.  Psychiatric/Behavioral:  Negative for confusion and sleep disturbance. The patient is not nervous/anxious.   All other systems reviewed and are negative.  Current Outpatient Medications on File Prior to Visit  Medication Sig Dispense Refill   acetaminophen (TYLENOL) 325 MG tablet Take 2 tablets (650 mg total) by mouth every 6 (six) hours as needed. 30 tablet 0   Multiple Vitamins-Minerals (MULTIPLE VITAMINS/WOMENS PO) Take 2 tablets by mouth daily. Gummy Vitamins     sucralfate (CARAFATE) 1 g tablet Take 1 tablet (1 g total) by mouth 4 (four) times daily -  with meals and at bedtime. 28 tablet 0   vitamin C (ASCORBIC ACID) 500 MG tablet Take 500 mg by mouth daily.     No current facility-administered medications on file prior to visit.     Past Medical History:  Diagnosis Date   Anemia    Bumps on skin    left lower abdomen   Chicken pox    GERD (gastroesophageal reflux disease)    Headache    Migraines   Irregular heart beat    Sarcoidosis     Past Surgical History:  Procedure Laterality Date   COLONOSCOPY     HYSTERECTOMY ABDOMINAL WITH SALPINGECTOMY Bilateral 05/15/2017   Procedure: HYSTERECTOMY ABDOMINAL WITH SALPINGECTOMY;  Surgeon: LPrincess Bruins MD;  Location: [facility];  Service: Gynecology;  Laterality: Bilateral;  request to follow  around 11:30am  requests 1 1/2 hours for the case.   TUBAL LIGATION      No Known Allergies  Family History  Problem Relation Age of Onset   Alcohol abuse Father    Hypertension Maternal Grandmother    Diabetes Maternal Grandmother    Hypertension Maternal Grandfather    Diabetes Sister    Cancer Neg Hx        colon or gyn    Social History   Socioeconomic  History   Marital status: Married    Spouse name: Not on file   Number of children: Not on file   Years of education: Not on file   Highest education level: Not on file  Occupational History   Not on file  Tobacco Use   Smoking status: Never   Smokeless tobacco: Never  Vaping Use   Vaping Use: Never used  Substance and Sexual Activity   Alcohol use: Yes    Comment: OCC   Drug use: No   Sexual activity: Yes    Partners: Male  Other Topics Concern   Not on file  Social History Narrative   Not on file   Social Determinants of Health   Financial Resource Strain: Not on file  Food Insecurity: Not on file  Transportation Needs: Not on file  Physical Activity: Not on file  Stress: Not on file  Social Connections: Not on file    Vitals:   03/13/22 0928  BP: 120/80  Pulse: 90  Temp: 98.4 F (36.9 C)  SpO2: 97%   Body mass index is 34.59 kg/m.  Wt Readings from Last 3 Encounters:  03/13/22 171 lb 4 oz (77.7 kg)  10/20/21 168 lb 9.6 oz (76.5 kg)  06/29/21 167 lb 1.6 oz (75.8 kg)     Physical Exam  ASSESSMENT AND PLAN:  Ms. Cheyenne Walters was here today annual physical examination.  Orders Placed This Encounter  Procedures   Hemoglobin Z6O   Basic metabolic panel   Lipid panel   EKG 12-Lead    Cheyenne Walters was seen today for annual exam and chest pain.  Diagnoses and all orders for this visit:  Routine general medical examination at a health care facility  Hyperlipidemia, unspecified hyperlipidemia type -     Lipid panel; Future -     Lipid panel  Screening for endocrine, metabolic and immunity disorder -     Hemoglobin A1c; Future -     Basic metabolic panel; Future -     Basic metabolic panel -     Hemoglobin A1c  Chest pain, unspecified type -     EKG 12-Lead  Gastroesophageal reflux disease, unspecified whether esophagitis present -     pantoprazole (PROTONIX) 20 MG tablet; Take 1 tablet (20 mg total) by mouth  daily.    Hyperlipidemia Non pharmacologic treatment recommended for now. Further recommendations will be given according to 10 years CVD risk score and lipid panel numbers.  GERD (gastroesophageal reflux disease) Recommend resuming Pantoprazole 20 mg, which she can take it prn. Stressed the importance of GERD precautions.   Return in 1 year (on 03/14/2023).  Cheyenne Samons G. [place], MD  [facility]. [place] office.

## 2022-04-29 IMAGING — MG MM DIGITAL SCREENING BILAT W/ TOMO AND CAD
6 of 10 series · 6 of 30 positions shown · non-contrast
Comparison: Previous exam(s).

CLINICAL DATA: Screening.

EXAM:
DIGITAL SCREENING BILATERAL MAMMOGRAM WITH TOMOSYNTHESIS AND CAD
TECHNIQUE: Bilateral screening digital craniocaudal and mediolateral oblique
mammograms were obtained. Bilateral screening digital breast
tomosynthesis was performed. The images were evaluated with
computer-aided detection.

[L MLO synth-2D (1 of 2)]
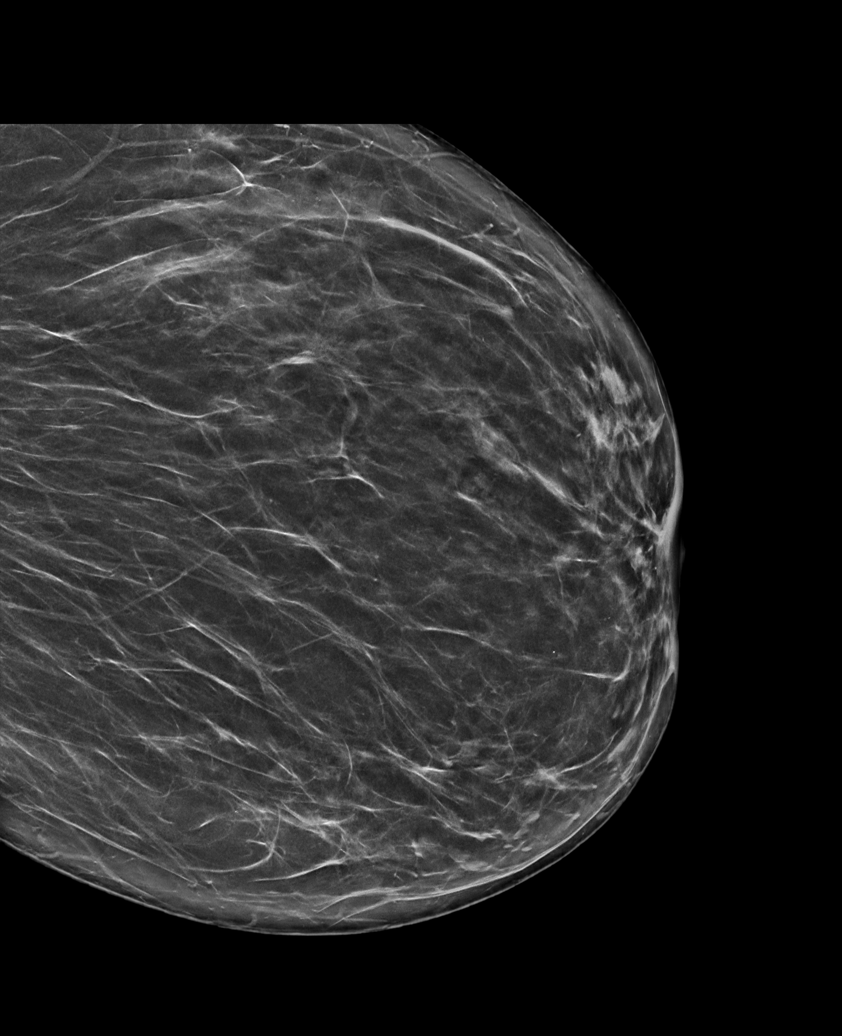

[R CC synth-2D]
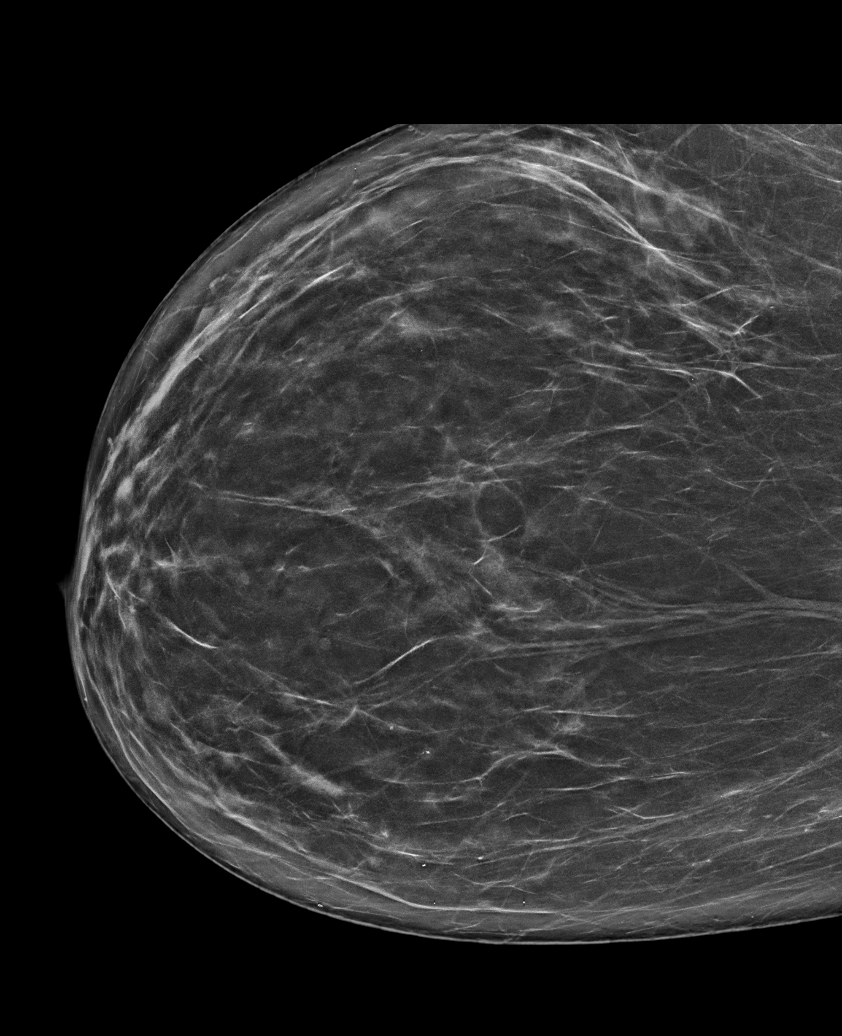

[L MLO synth-2D (2 of 2)]
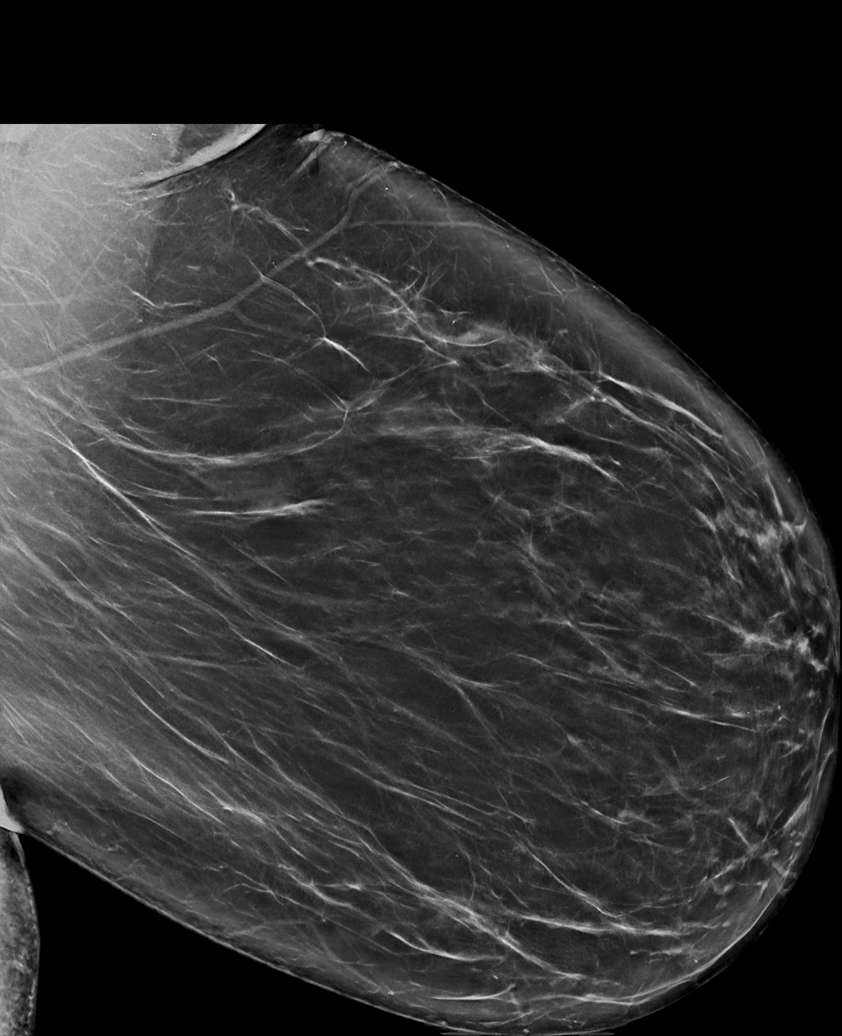

[L CC synth-2D]
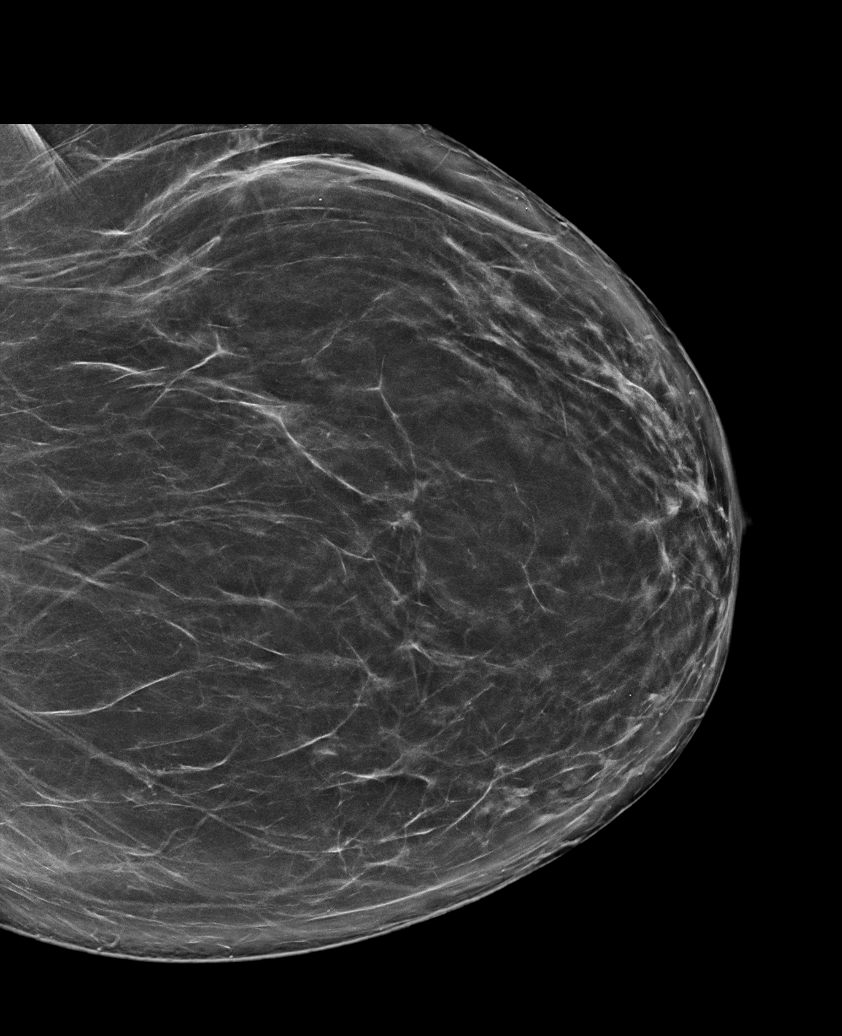

[R MLO synth-2D]
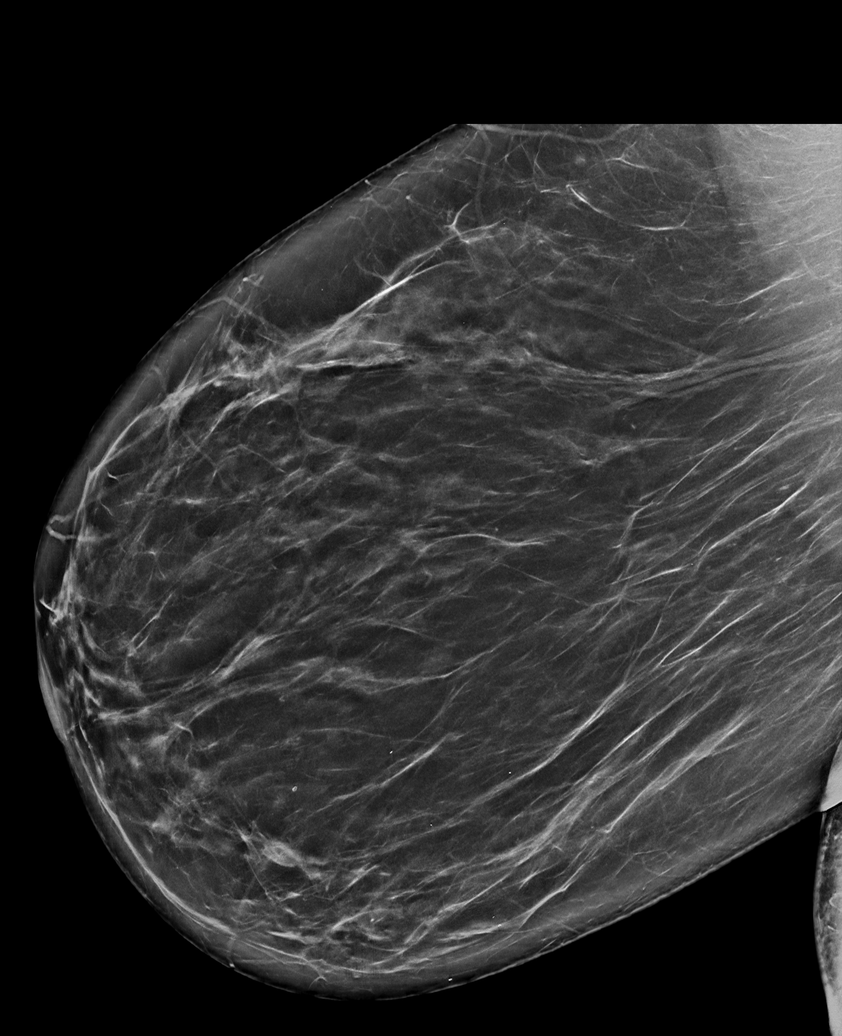

[L MLO tomo · tomo slice 46/91.0]
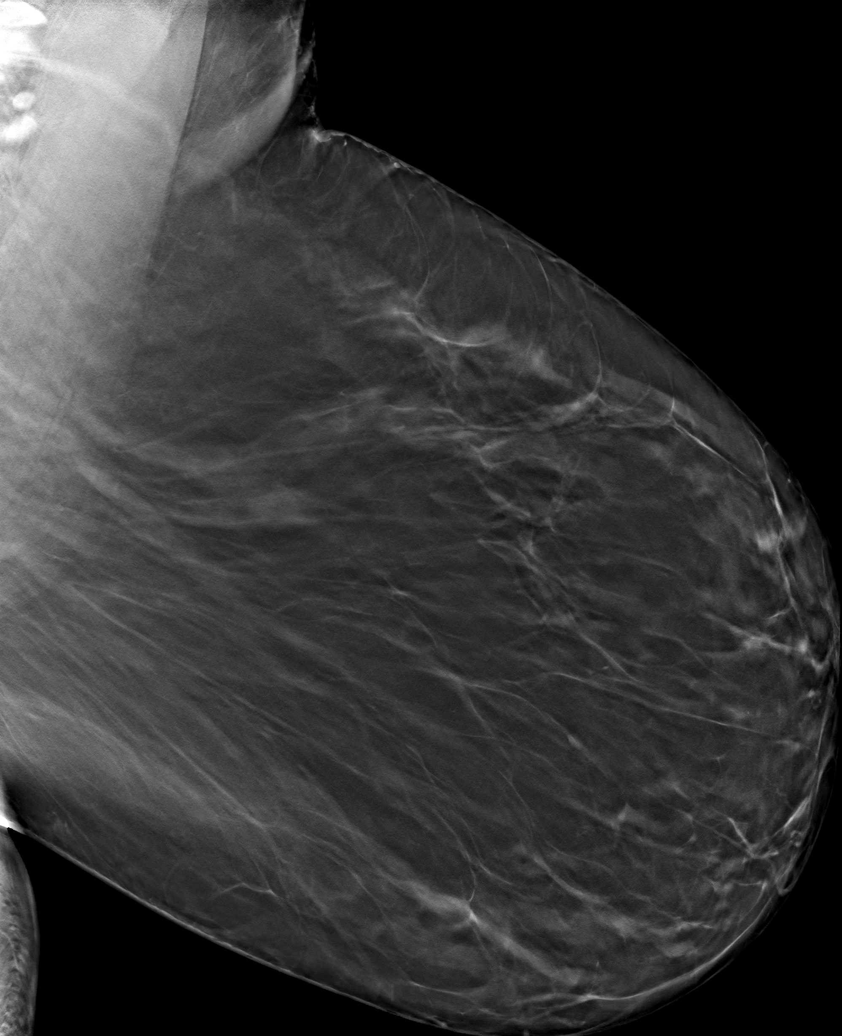

[6 of 30 positions shown; findings below may reference images not displayed]

ACR Breast Density Category b: There are scattered areas of
fibroglandular density.
FINDINGS: There are no findings suspicious for malignancy.
IMPRESSION: No mammographic evidence of malignancy. A result letter of this
screening mammogram will be mailed directly to the patient.

RECOMMENDATION:
Screening mammogram in one year. (Code:51-O-LD2)

BI-RADS CATEGORY  1: Negative.

## 2022-06-26 ENCOUNTER — Other Ambulatory Visit: Payer: Self-pay | Admitting: Family Medicine

## 2022-06-26 DIAGNOSIS — Z1231 Encounter for screening mammogram for malignant neoplasm of breast: Secondary | ICD-10-CM

## 2022-07-10 ENCOUNTER — Ambulatory Visit
Admission: RE | Admit: 2022-07-10 | Discharge: 2022-07-10 | Disposition: A | Discharge status: Home / Self Care | Payer: BC Managed Care – PPO | Source: Ambulatory Visit

## 2022-07-10 DIAGNOSIS — Z1231 Encounter for screening mammogram for malignant neoplasm of breast: Secondary | ICD-10-CM

## 2022-10-18 ENCOUNTER — Ambulatory Visit (INDEPENDENT_AMBULATORY_CARE_PROVIDER_SITE_OTHER): Payer: BC Managed Care – PPO | Admitting: Family Medicine

## 2022-10-18 ENCOUNTER — Encounter: Payer: Self-pay | Admitting: Family Medicine

## 2022-10-18 VITALS — BP 124/84 | HR 90 | Temp 98.8°F | Ht 59.0 in | Wt 173.2 lb

## 2022-10-18 DIAGNOSIS — R059 Cough, unspecified: Secondary | ICD-10-CM

## 2022-10-18 LAB — POC COVID19 BINAXNOW: SARS Coronavirus 2 Ag: NEGATIVE

## 2022-10-18 MED ORDER — BENZONATATE 100 MG PO CAPS: 100.0000 mg | ORAL_CAPSULE | Freq: Two times a day (BID) | ORAL | 0 refills | Status: DC | PRN

## 2022-10-18 NOTE — Progress Notes (Signed)
Established Patient Office Visit   Subjective  Patient ID: Cheyenne Walters, female    DOB: [birth year]  Age: 52 y.o. MRN: [id number]  Chief Complaint  Patient presents with   Cough    Pt reports sx of dry cough, fatigue, headache- subsided after taking tylenol,bodyache. Denied chills, sore throat. Taking robitussin and theraflu.     Patient is a 52 year old female with pmh sig for sarcoidosis, HTN, GERD, HLD, obesity followed by Dr. [place] and seen for acute concern.  Patient endorses cough x 4 days.  Started feeling tired on Saturday after attending a baby shower.  Then developed dry cough and decreased appetite.  Trying to stay hydrated but does not feel like drinking.  Denies other symptoms including SOB, wheezing, chest tightness, rhinorrhea, headache, sore throat, postnasal drainage, ear pain/pressure.  Patient endorses soreness in chest from coughing.  Has taken TheraFlu and Robitussin.  Other possible sick contacts include her husband who had a sinus infection.  Patient had a negative at-home COVID test yesterday.  Cough The current episode started in the past 7 days. The cough is Non-productive.      Review of Systems  Respiratory:  Positive for cough.    Negative unless stated above    Objective:     BP 124/84 (BP Location: Right Arm, Cuff Size: Normal)   Pulse 90   Temp 98.8 F (37.1 C) (Oral)   Ht '4\' 11"'$  (1.499 m)   Wt 173 lb 3.2 oz (78.6 kg)   LMP 04/30/2017 (Exact Date)   SpO2 95%   BMI 34.98 kg/m    Physical Exam Constitutional:      Appearance: Normal appearance.  HENT:     Head: Normocephalic and atraumatic.     Right Ear: Tympanic membrane normal.     Left Ear: Tympanic membrane normal.     Nose: Nose normal. No congestion or rhinorrhea.     Mouth/Throat:     Mouth: Mucous membranes are moist.     Pharynx: No oropharyngeal exudate or posterior oropharyngeal erythema.  Eyes:     Extraocular Movements: Extraocular movements intact.      Conjunctiva/sclera: Conjunctivae normal.     Pupils: Pupils are equal, round, and reactive to light.  Cardiovascular:     Rate and Rhythm: Normal rate and regular rhythm.     Heart sounds: Normal heart sounds.  Pulmonary:     Effort: Pulmonary effort is normal.     Breath sounds: Normal breath sounds.     Comments: Occasional dry cough. Musculoskeletal:     Cervical back: No tenderness.  Lymphadenopathy:     Cervical: No cervical adenopathy.  Skin:    General: Skin is warm and dry.  Neurological:     Mental Status: She is alert and oriented to person, place, and time.      Results for orders placed or performed in visit on 10/18/22  POC COVID-19  Result Value Ref Range   SARS Coronavirus 2 Ag Negative Negative      Assessment & Plan:  Cough, unspecified type -     POC COVID-19 BinaxNow -     Benzonatate; Take 1 capsule (100 mg total) by mouth 2 (two) times daily as needed for cough.  Dispense: 20 capsule; Refill: 0  COVID testing negative.  Discussed possible causes of cough including viral etiology, postnasal drainage causing allergies.  Also consider sarcoidosis flare.  Tessalon as needed.  Continue OTC supportive medication.  Tylenol/heat for chest discomfort 2/2 cough.  For continued or worsening symptoms notify clinic.  Return if symptoms worsen or fail to improve.   Billie Ruddy, MD

## 2022-10-18 NOTE — Addendum Note (Signed)
Addended by: Grier Mitts R on: 10/18/2022 03:04 PM   Modules accepted: Level of Service

## 2023-01-10 ENCOUNTER — Ambulatory Visit (INDEPENDENT_AMBULATORY_CARE_PROVIDER_SITE_OTHER): Payer: BC Managed Care – PPO | Admitting: Family Medicine

## 2023-01-10 ENCOUNTER — Encounter: Payer: Self-pay | Admitting: Family Medicine

## 2023-01-10 VITALS — BP 128/84 | HR 88 | Temp 98.5°F | Wt 174.0 lb

## 2023-01-10 DIAGNOSIS — R142 Eructation: Secondary | ICD-10-CM | POA: Diagnosis not present

## 2023-01-10 DIAGNOSIS — R194 Change in bowel habit: Secondary | ICD-10-CM

## 2023-01-10 DIAGNOSIS — R630 Anorexia: Secondary | ICD-10-CM

## 2023-01-10 DIAGNOSIS — K219 Gastro-esophageal reflux disease without esophagitis: Secondary | ICD-10-CM | POA: Diagnosis not present

## 2023-01-10 MED ORDER — OMEPRAZOLE 20 MG PO CPDR: 20.0000 mg | DELAYED_RELEASE_CAPSULE | Freq: Two times a day (BID) | ORAL | 1 refills | Status: DC

## 2023-01-10 NOTE — Progress Notes (Signed)
Established Patient Office Visit   Subjective  Patient ID: Cheyenne Walters, female    DOB: 10-16-1970  Age: 52 y.o. MRN: [id number]  Chief Complaint  Patient presents with   Abdominal Pain    Top of stomach pain, is no table to eat a kids meal. Takes fiber daily, but has not been able to go to bathroom.  Having issues with acid reflux, and being hot at times     Patient is a 52 year old female followed by Dr. [place] and seen for ongoing concern.  Patient endorses increased acid reflux, gas, burning stomach pain, decreased appetite x 1 month.  Patient was on PPI but stopped taking it 3 months ago.  History of GERD.  Cucumbers, broccoli, tomatoes, spicy foods typically cause to symptoms.  Patient states if she gets a kids meal, may eat 1 chicken nugget.  Endorses increased belching whether she takes PPI or not.  Will drink a few sips of soda to help burp.  Patient denies chronic NSAID use.  Patient endorses constipation.  May take OTC meds to help with symptoms.  Describes BMs as small pebbles.  Pt had colonoscopy around 2018/2019, does not [place].  When advised on referral to GI patient initially said she would wait until her physical over the summer.  Patient asked why she presents today, states family advised her to.  Patient mentions hot flashes.  Abdominal Pain      Review of Systems  Gastrointestinal:  Positive for abdominal pain.   Negative unless stated above    Objective:     BP 128/84 (BP Location: Right Arm, Patient Position: Sitting, Cuff Size: Normal)   Pulse 88   Temp 98.5 F (36.9 C) (Oral)   Wt 174 lb (78.9 kg)   LMP 04/30/2017 (Exact Date)   SpO2 97%   BMI 35.14 kg/m    Physical Exam Constitutional:      General: She is not in acute distress.    Appearance: Normal appearance.  HENT:     Head: Normocephalic and atraumatic.     Nose: Nose normal.     Mouth/Throat:     Mouth: Mucous membranes are moist.  Cardiovascular:     Rate and Rhythm:  Normal rate and regular rhythm.     Heart sounds: Normal heart sounds. No murmur heard.    No gallop.  Pulmonary:     Effort: Pulmonary effort is normal. No respiratory distress.     Breath sounds: Normal breath sounds. No wheezing, rhonchi or rales.  Abdominal:     General: Bowel sounds are normal.     Tenderness: There is no abdominal tenderness. There is no guarding or rebound.  Skin:    General: Skin is warm and dry.  Neurological:     Mental Status: She is alert and oriented to person, place, and time.      No results found for any visits on 01/10/23.    Assessment & Plan:  Gastroesophageal reflux disease, unspecified whether esophagitis present -     Omeprazole; Take 1 capsule (20 mg total) by mouth 2 (two) times daily before a meal.  Dispense: 60 capsule; Refill: 1 -     Ambulatory referral to Gastroenterology  Bowel habit changes -     Ambulatory referral to Gastroenterology  Belching -     Omeprazole; Take 1 capsule (20 mg total) by mouth 2 (two) times daily before a meal.  Dispense: 60 capsule; Refill: 1 -     Ambulatory referral  to Gastroenterology  Decreased appetite -     Ambulatory referral to Gastroenterology  Patient is a 52 year old female followed by Dr. [place] presents with ongoing GI concerns noted after stopping PPI.  Patient advised to restart PPI.  Rx or omeprazole 20 mg twice daily sent to pharmacy.  Referral to GI placed for EGD.  Return with pcp for continued or worsened symptoms.   Deeann Saint, MD

## 2023-01-22 ENCOUNTER — Other Ambulatory Visit: Payer: Self-pay | Admitting: Family Medicine

## 2023-01-22 DIAGNOSIS — R142 Eructation: Secondary | ICD-10-CM

## 2023-01-22 DIAGNOSIS — K219 Gastro-esophageal reflux disease without esophagitis: Secondary | ICD-10-CM

## 2023-06-22 ENCOUNTER — Encounter: Payer: Self-pay | Admitting: Family Medicine

## 2023-06-22 ENCOUNTER — Ambulatory Visit (INDEPENDENT_AMBULATORY_CARE_PROVIDER_SITE_OTHER): Payer: BC Managed Care – PPO | Admitting: Family Medicine

## 2023-06-22 VITALS — BP 118/80 | HR 100 | Temp 98.2°F | Resp 12 | Ht 61.5 in | Wt 181.2 lb

## 2023-06-22 DIAGNOSIS — Z23 Encounter for immunization: Secondary | ICD-10-CM | POA: Diagnosis not present

## 2023-06-22 DIAGNOSIS — I10 Essential (primary) hypertension: Secondary | ICD-10-CM | POA: Diagnosis not present

## 2023-06-22 DIAGNOSIS — R7303 Prediabetes: Secondary | ICD-10-CM | POA: Diagnosis not present

## 2023-06-22 DIAGNOSIS — K219 Gastro-esophageal reflux disease without esophagitis: Secondary | ICD-10-CM

## 2023-06-22 DIAGNOSIS — Z0000 Encounter for general adult medical examination without abnormal findings: Secondary | ICD-10-CM | POA: Diagnosis not present

## 2023-06-22 DIAGNOSIS — E785 Hyperlipidemia, unspecified: Secondary | ICD-10-CM | POA: Diagnosis not present

## 2023-06-22 LAB — COMPREHENSIVE METABOLIC PANEL
ALT: 5 U/L (ref 0–35)
AST: 15 U/L (ref 0–37)
Albumin: 4 g/dL (ref 3.5–5.2)
Alkaline Phosphatase: 93 U/L (ref 39–117)
BUN: 12 mg/dL (ref 6–23)
CO2: 29 meq/L (ref 19–32)
Calcium: 9.2 mg/dL (ref 8.4–10.5)
Chloride: 103 meq/L (ref 96–112)
Creatinine, Ser: 0.75 mg/dL (ref 0.40–1.20)
GFR: 90.4 mL/min (ref >60.00)
Glucose, Bld: 103 mg/dL — ABNORMAL HIGH (ref 70–99)
Potassium: 4.3 meq/L (ref 3.5–5.1)
Sodium: 139 meq/L (ref 135–145)
Total Bilirubin: 0.4 mg/dL (ref 0.2–1.2)
Total Protein: 7.3 g/dL (ref 6.0–8.3)

## 2023-06-22 LAB — LIPID PANEL
Cholesterol: 228 mg/dL — ABNORMAL HIGH (ref 0–200)
HDL: 53.6 mg/dL (ref >39.00)
LDL Cholesterol: 144 mg/dL — ABNORMAL HIGH (ref 0–99)
NonHDL: 174.71
Total CHOL/HDL Ratio: 4
Triglycerides: 152 mg/dL — ABNORMAL HIGH (ref 0.0–149.0)
VLDL: 30.4 mg/dL (ref 0.0–40.0)

## 2023-06-22 LAB — HEMOGLOBIN A1C: Hgb A1c MFr Bld: 5.9 % (ref 4.6–6.5)

## 2023-06-22 MED ORDER — OMEPRAZOLE 20 MG PO CPDR: 20.0000 mg | DELAYED_RELEASE_CAPSULE | Freq: Two times a day (BID) | ORAL | Status: DC

## 2023-06-22 NOTE — Assessment & Plan Note (Signed)
We discussed the importance of regular physical activity and healthy diet for prevention of chronic illness and/or complications. Preventive guidelines reviewed. Vaccination: Tdap given today, she prefers to hold on flu and Shingrix vaccine. Status post hysterectomy. Contact information from GI given, so she can arrange appointment for colonoscopy. Next CPE in a year.

## 2023-06-22 NOTE — Assessment & Plan Note (Signed)
Hemoglobin A1c was 5.9 in 02/2022. Encouraged consistency with a healthy lifestyle for diabetes prevention.

## 2023-06-22 NOTE — Patient Instructions (Addendum)
A few things to remember from today's visit:  Routine general medical examination at a health care facility  Hyperlipidemia, unspecified hyperlipidemia type - Plan: Comprehensive metabolic panel, Lipid panel  HYPERTENSION - Plan: Comprehensive metabolic panel  Gastroesophageal reflux disease, unspecified whether esophagitis present - Plan: omeprazole (PRILOSEC) 20 MG capsule  Prediabetes - Plan: Hemoglobin A1c Please call gastroenterologist's office to discuss scheduling at [phone] option 1 Monitor blood pressure at home, goal at 130/80 or less.  Low salt diet.  If you need refills for medications you take chronically, please call your pharmacy. Do not use My Chart to request refills or for acute issues that need immediate attention. If you send a my chart message, it may take a few days to be addressed, specially if I am not in the office.  Please be sure medication list is accurate. If a new problem present, please set up appointment sooner than planned today.  Health Maintenance, Female Adopting a healthy lifestyle and getting preventive care are important in promoting health and wellness. Ask your health care provider about: The right schedule for you to have regular tests and exams. Things you can do on your own to prevent diseases and keep yourself healthy. What should I know about diet, weight, and exercise? Eat a healthy diet  Eat a diet that includes plenty of vegetables, fruits, low-fat dairy products, and lean protein. Do not eat a lot of foods that are high in solid fats, added sugars, or sodium. Maintain a healthy weight Body mass index (BMI) is used to identify weight problems. It estimates body fat based on height and weight. Your health care provider can help determine your BMI and help you achieve or maintain a healthy weight. Get regular exercise Get regular exercise. This is one of the most important things you can do for your health. Most adults  should: Exercise for at least 150 minutes each week. The exercise should increase your heart rate and make you sweat (moderate-intensity exercise). Do strengthening exercises at least twice a week. This is in addition to the moderate-intensity exercise. Spend less time sitting. Even light physical activity can be beneficial. Watch cholesterol and blood lipids Have your blood tested for lipids and cholesterol at 54 years of age, then have this test every 5 years. Have your cholesterol levels checked more often if: Your lipid or cholesterol levels are high. You are older than 54 years of age. You are at high risk for heart disease. What should I know about cancer screening? Depending on your health history and family history, you may need to have cancer screening at various ages. This may include screening for: Breast cancer. Cervical cancer. Colorectal cancer. Skin cancer. Lung cancer. What should I know about heart disease, diabetes, and high blood pressure? Blood pressure and heart disease High blood pressure causes heart disease and increases the risk of stroke. This is more likely to develop in people who have high blood pressure readings or are overweight. Have your blood pressure checked: Every 3-5 years if you are 32-4 years of age. Every year if you are 34 years old or older. Diabetes Have regular diabetes screenings. This checks your fasting blood sugar level. Have the screening done: Once every three years after age 70 if you are at a normal weight and have a low risk for diabetes. More often and at a younger age if you are overweight or have a high risk for diabetes. What should I know about preventing infection? Hepatitis B If you  have a higher risk for hepatitis B, you should be screened for this virus. Talk with your health care provider to find out if you are at risk for hepatitis B infection. Hepatitis C Testing is recommended for: Everyone born from [birth year] through  1965. Anyone with known risk factors for hepatitis C. Sexually transmitted infections (STIs) Get screened for STIs, including gonorrhea and chlamydia, if: You are sexually active and are younger than 54 years of age. You are older than 54 years of age and your health care provider tells you that you are at risk for this type of infection. Your sexual activity has changed since you were last screened, and you are at increased risk for chlamydia or gonorrhea. Ask your health care provider if you are at risk. Ask your health care provider about whether you are at high risk for HIV. Your health care provider may recommend a prescription medicine to help prevent HIV infection. If you choose to take medicine to prevent HIV, you should first get tested for HIV. You should then be tested every 3 months for as long as you are taking the medicine. Pregnancy If you are about to stop having your period (premenopausal) and you may become pregnant, seek counseling before you get pregnant. Take 400 to 800 micrograms (mcg) of folic acid every day if you become pregnant. Ask for birth control (contraception) if you want to prevent pregnancy. Osteoporosis and menopause Osteoporosis is a disease in which the bones lose minerals and strength with aging. This can result in bone fractures. If you are 5 years old or older, or if you are at risk for osteoporosis and fractures, ask your health care provider if you should: Be screened for bone loss. Take a calcium or vitamin D supplement to lower your risk of fractures. Be given hormone replacement therapy (HRT) to treat symptoms of menopause. Follow these instructions at home: Alcohol use Do not drink alcohol if: Your health care provider tells you not to drink. You are pregnant, may be pregnant, or are planning to become pregnant. If you drink alcohol: Limit how much you have to: 0-1 drink a day. Know how much alcohol is in your drink. In the [place] one drink  equals one 12 oz bottle of beer (355 mL), one 5 oz glass of wine (148 mL), or one 1 oz glass of hard liquor (44 mL). Lifestyle Do not use any products that contain nicotine or tobacco. These products include cigarettes, chewing tobacco, and vaping devices, such as e-cigarettes. If you need help quitting, ask your health care provider. Do not use street drugs. Do not share needles. Ask your health care provider for help if you need support or information about quitting drugs. General instructions Schedule regular health, dental, and eye exams. Stay current with your vaccines. Tell your health care provider if: You often feel depressed. You have ever been abused or do not feel safe at home. Summary Adopting a healthy lifestyle and getting preventive care are important in promoting health and wellness. Follow your health care provider's instructions about healthy diet, exercising, and getting tested or screened for diseases. Follow your health care provider's instructions on monitoring your cholesterol and blood pressure. This information is not intended to replace advice given to you by your health care provider. Make sure you discuss any questions you have with your health care provider. Document Revised: 01/31/2021 Document Reviewed: 01/31/2021 Elsevier Patient Education  2024 [employer].

## 2023-06-22 NOTE — Assessment & Plan Note (Addendum)
BP today adequately controlled. Recommend monitoring BP at home regularly and to let me know if BP readings are consistently above 130/80.  We discussed appropriate technique. For now continue nonpharmacologic treatment: Low-salt/DASH diet. Eye exam is current.

## 2023-06-22 NOTE — Assessment & Plan Note (Signed)
Non pharmacologic treatment to continue for now. Further recommendations will be given according to 10 years CVD risk score and lipid panel numbers.  

## 2023-06-22 NOTE — Progress Notes (Signed)
HPI: [name] is a 52 y.o. female  with PMHx significant for HTN, GERD, HLD, migraine headaches, and sarcoidosis in remission for 6 years here today for her routine physical.  Last CPE: 03/13/2022 No new concerns since her last visit.   Exercise: Walking every morning a few times per week for about 30 minutes.  Diet: Eating home cooked meals. Tries to eat vegetables daily and mostly eats chicken and fish. Snacks on sea-salt almonds. With her new work schedule she is eating dinner after 8 pm.   Sleep: 5-6 hours a night.  Smoking: Never.  Alcohol consumption: Occasionally drinks 2 glasses of wine 1-2x/month.   Dental: Last follow up with about 3 weeks ago for cleaning.  Vision: Last followed up for routine vision care about 3 months ago  Immunization History  Administered Date(s) Administered   Tdap 05/07/2013, 06/22/2023   Health Maintenance  Topic Date Due   Colonoscopy  Never done   COVID-19 Vaccine (1 - 2023-24 season) 07/08/2023 (Originally 05/27/2023)   Zoster Vaccines- Shingrix (1 of 2) 09/21/2023 (Originally 04/09/2019)   INFLUENZA VACCINE  12/24/2023 (Originally 04/26/2023)   Cervical Cancer Screening (HPV/Pap Cotest)  06/21/2024 (Originally 04/09/1999)   HIV Screening  06/21/2028 (Originally 04/08/1984)   MAMMOGRAM  07/10/2024   DTaP/Tdap/Td (3 - Td or Tdap) 06/21/2033   Hepatitis C Screening  Completed   HPV VACCINES  Aged Out   She follows with gyn regularly, her gyn left practice. S/P hysterectomy.  Hypertension:  States her BP tends to fluctuate.  Reports a systolic reading of 168 a couple of weeks ago, but notes that at the time she had a migraine for a week straight.  In general 130's/70-80. She is on non pharmacologic treatment.  Negative for unusual visual changes, exertional chest pain, dyspnea, focal weakness, or edema.  Lab Results  Component Value Date   CREATININE 0.71 03/13/2022   BUN 15 03/13/2022   NA 138 03/13/2022   K 3.8 03/13/2022    CL 104 03/13/2022   CO2 29 03/13/2022   GERD: Currently on omeprazole 20 mg once daily, before breakfast.  She notes it is difficult to take twice daily due to working 3rd shift, works from 3-11:30  HLD on non pharmacologic treatment. Lab Results  Component Value Date    CHOL 220 (H) 03/13/2022    HDL 52.70 03/13/2022    LDLCALC 145 (H) 03/13/2022    TRIG 113.0 03/13/2022    CHOLHDL 4 03/13/2022    Prediabetes:      Lab Results  Component Value Date    HGBA1C 5.9 03/13/2022    Review of Systems  Constitutional:  Negative for activity change, appetite change and fever.  HENT:  Negative for hearing loss, mouth sores, sore throat and trouble swallowing.   Eyes:  Negative for redness and visual disturbance.  Respiratory:  Negative for cough, shortness of breath and wheezing.   Cardiovascular:  Negative for chest pain and leg swelling.  Gastrointestinal:  Negative for abdominal pain, nausea and vomiting.       No changes in bowel habits.  Endocrine: Negative for cold intolerance, heat intolerance, polydipsia, polyphagia and polyuria.  Genitourinary:  Negative for decreased urine volume, dysuria, hematuria, vaginal bleeding and vaginal discharge.  Musculoskeletal:  Negative for gait problem and myalgias.  Skin:  Negative for color change and rash.  Allergic/Immunologic: Negative for environmental allergies.  Neurological:  Negative for seizures, syncope, facial asymmetry and weakness.  Hematological:  Negative for adenopathy. Does not  bruise/bleed easily.  Psychiatric/Behavioral:  Negative for behavioral problems and confusion.   All other systems reviewed and are negative.  Current Outpatient Medications on File Prior to Visit  Medication Sig Dispense Refill   Multiple Vitamins-Minerals (MULTIPLE VITAMINS/WOMENS PO) Take 2 tablets by mouth daily. Gummy Vitamins     No current facility-administered medications on file prior to visit.   Past Medical History:  Diagnosis Date    Anemia    Bumps on skin    left lower abdomen   Chicken pox    GERD (gastroesophageal reflux disease)    Headache    Migraines   Irregular heart beat    Sarcoidosis     Past Surgical History:  Procedure Laterality Date   COLONOSCOPY     HYSTERECTOMY ABDOMINAL WITH SALPINGECTOMY Bilateral 05/15/2017   Procedure: HYSTERECTOMY ABDOMINAL WITH SALPINGECTOMY;  Surgeon: Genia Del, MD;  Location: [facility];  Service: Gynecology;  Laterality: Bilateral;  request to follow around 11:30am  requests 1 1/2 hours for the case.   TUBAL LIGATION      No Known Allergies  Family History  Problem Relation Age of Onset   Alcohol abuse Father    Hypertension Maternal Grandmother    Diabetes Maternal Grandmother    Hypertension Maternal Grandfather    Diabetes Sister    Cancer Neg Hx        colon or gyn    Social History   Socioeconomic History   Marital status: Married    Spouse name: Not on file   Number of children: Not on file   Years of education: Not on file   Highest education level: Not on file  Occupational History   Not on file  Tobacco Use   Smoking status: Never   Smokeless tobacco: Never  Vaping Use   Vaping status: Never Used  Substance and Sexual Activity   Alcohol use: Yes    Comment: OCC   Drug use: No   Sexual activity: Yes    Partners: Male  Other Topics Concern   Not on file  Social History Narrative   Not on file   Social Determinants of Health   Financial Resource Strain: Not on file  Food Insecurity: Not on file  Transportation Needs: Not on file  Physical Activity: Not on file  Stress: Not on file  Social Connections: Not on file   Vitals:   06/22/23 0811  BP: 118/80  Pulse: 100  Resp: 12  Temp: 98.2 F (36.8 C)  SpO2: 98%   Body mass index is 33.69 kg/m.  Wt Readings from Last 3 Encounters:  06/22/23 181 lb 4 oz (82.2 kg)  01/10/23 174 lb (78.9 kg)  10/18/22 173 lb 3.2 oz (78.6 kg)   Physical Exam Vitals and nursing  note reviewed.  Constitutional:      General: She is not in acute distress.    Appearance: She is well-developed.  HENT:     Head: Normocephalic and atraumatic.     Right Ear: Tympanic membrane, ear canal and external ear normal.     Left Ear: Tympanic membrane, ear canal and external ear normal.     Mouth/Throat:     Mouth: Mucous membranes are moist.     Pharynx: Oropharynx is clear. Uvula midline.  Eyes:     Extraocular Movements: Extraocular movements intact.     Conjunctiva/sclera: Conjunctivae normal.     Pupils: Pupils are equal, round, and reactive to light.  Neck:     Thyroid: No  thyromegaly.     Trachea: No tracheal deviation.  Cardiovascular:     Rate and Rhythm: Normal rate and regular rhythm.     Pulses:          Dorsalis pedis pulses are 2+ on the right side and 2+ on the left side.     Heart sounds: No murmur heard. Pulmonary:     Effort: Pulmonary effort is normal. No respiratory distress.     Breath sounds: Normal breath sounds.  Abdominal:     Palpations: Abdomen is soft. There is no hepatomegaly or mass.     Tenderness: There is no abdominal tenderness.  Genitourinary:    Comments: No concerns today. Musculoskeletal:     Comments: No major deformity or signs of synovitis appreciated.  Lymphadenopathy:     Cervical: No cervical adenopathy.     Upper Body:     Right upper body: No supraclavicular adenopathy.     Left upper body: No supraclavicular adenopathy.  Skin:    General: Skin is warm.     Findings: No erythema or rash.  Neurological:     General: No focal deficit present.     Mental Status: She is alert and oriented to person, place, and time.     Cranial Nerves: No cranial nerve deficit.     Coordination: Coordination normal.     Gait: Gait normal.     Deep Tendon Reflexes:     Reflex Scores:      Bicep reflexes are 2+ on the right side and 2+ on the left side.      Patellar reflexes are 2+ on the right side and 2+ on the left  side. Psychiatric:        Mood and Affect: Mood and affect normal.   ASSESSMENT AND PLAN: Ms. Cheyenne Walters was here today annual physical examination.  Orders Placed This Encounter  Procedures   Tdap vaccine greater than or equal to 7yo IM   Comprehensive metabolic panel   Hemoglobin A1c   Lipid panel   Lab Results  Component Value Date   HGBA1C 5.9 06/22/2023   Lab Results  Component Value Date   NA 139 06/22/2023   CL 103 06/22/2023   K 4.3 06/22/2023   CO2 29 06/22/2023   BUN 12 06/22/2023   CREATININE 0.75 06/22/2023   GFR 90.40 06/22/2023   CALCIUM 9.2 06/22/2023   ALBUMIN 4.0 06/22/2023   GLUCOSE 103 (H) 06/22/2023   Lab Results  Component Value Date   ALT 5 06/22/2023   AST 15 06/22/2023   ALKPHOS 93 06/22/2023   BILITOT 0.4 06/22/2023   Lab Results  Component Value Date   CHOL 228 (H) 06/22/2023   HDL 53.60 06/22/2023   LDLCALC 144 (H) 06/22/2023   TRIG 152.0 (H) 06/22/2023   CHOLHDL 4 06/22/2023   The 10-year ASCVD risk score (Arnett DK, et al., 2019) is: 2.6%   Values used to calculate the score:     Age: 22 years     Sex: Female     Is Non-Hispanic African American: Yes     Diabetic: No     Tobacco smoker: No     Systolic Blood Pressure: 118 mmHg     Is BP treated: No     HDL Cholesterol: 53.6 mg/dL     Total Cholesterol: 228 mg/dL  Non HDL 17.  Routine general medical examination at a health care facility Assessment & Plan: We discussed the importance of regular physical  activity and healthy diet for prevention of chronic illness and/or complications. Preventive guidelines reviewed. Vaccination: Tdap given today, she prefers to hold on flu and Shingrix vaccine. Status post hysterectomy. Contact information from GI given, so she can arrange appointment for colonoscopy. Next CPE in a year.   Hyperlipidemia, unspecified hyperlipidemia type Assessment & Plan: Non pharmacologic treatment to continue for now. Further recommendations  will be given according to 10 years CVD risk score and lipid panel numbers.  Orders: -     Comprehensive metabolic panel; Future -     Lipid panel; Future  HYPERTENSION Assessment & Plan: BP today adequately controlled. Recommend monitoring BP at home regularly and to let me know if BP readings are consistently above 130/80.  We discussed appropriate technique. For now continue nonpharmacologic treatment: Low-salt/DASH diet. Eye exam is current.  Orders: -     Comprehensive metabolic panel; Future  Gastroesophageal reflux disease, unspecified whether esophagitis present Assessment & Plan: Problem is adequately controlled with omeprazole 20 mg daily. No changes in current management. GERD precautions also recommended.  Orders: -     Omeprazole; Take 1 capsule (20 mg total) by mouth 2 (two) times daily before a meal.  Prediabetes Assessment & Plan: Hemoglobin A1c was 5.9 in 02/2022. Encouraged consistency with a healthy lifestyle for diabetes prevention.  Orders: -     Hemoglobin A1c; Future  Need for Tdap vaccination -     Tdap vaccine greater than or equal to 7yo IM   Return in 1 year (on 06/21/2024) for CPE, chronic problems.  I,Rachel Rivera,acting as a scribe for Aroush Chasse [place], MD.,have documented all relevant documentation on the behalf of Moosa Bueche [place], MD,as directed by  Danilynn Jemison [place], MD while in the presence of Mariavictoria Nottingham [place], MD.  I, Isabelle Course, have reviewed all documentation for this visit. The documentation on 06/22/23 for the exam, diagnosis, procedures, and orders are all accurate and complete.  Aasim Restivo G. [place], MD  [facility]. [place] office.

## 2023-06-22 NOTE — Assessment & Plan Note (Signed)
Problem is adequately controlled with omeprazole 20 mg daily. No changes in current management. GERD precautions also recommended.

## 2023-06-23 MED ORDER — OMEPRAZOLE 20 MG PO CPDR: 20.0000 mg | DELAYED_RELEASE_CAPSULE | Freq: Every day | ORAL | Status: AC

## 2023-06-28 ENCOUNTER — Telehealth: Payer: Self-pay | Admitting: Family Medicine

## 2023-06-28 NOTE — Telephone Encounter (Signed)
Pt returned call for results. Advised she works 2nd shift and if possible call prior to 2pm

## 2023-06-29 NOTE — Telephone Encounter (Signed)
See result note.  

## 2023-07-11 ENCOUNTER — Other Ambulatory Visit: Payer: Self-pay | Admitting: Family Medicine

## 2023-07-11 DIAGNOSIS — Z1231 Encounter for screening mammogram for malignant neoplasm of breast: Secondary | ICD-10-CM

## 2023-08-02 DIAGNOSIS — Z1231 Encounter for screening mammogram for malignant neoplasm of breast: Secondary | ICD-10-CM

## 2023-08-30 ENCOUNTER — Ambulatory Visit
Admission: RE | Admit: 2023-08-30 | Discharge: 2023-08-30 | Disposition: A | Discharge status: Home / Self Care | Payer: BC Managed Care – PPO | Source: Ambulatory Visit | Attending: Family Medicine | Admitting: Family Medicine

## 2023-08-30 DIAGNOSIS — Z1231 Encounter for screening mammogram for malignant neoplasm of breast: Secondary | ICD-10-CM

## 2023-11-06 ENCOUNTER — Ambulatory Visit (INDEPENDENT_AMBULATORY_CARE_PROVIDER_SITE_OTHER): Payer: BC Managed Care – PPO

## 2023-11-06 ENCOUNTER — Encounter: Payer: Self-pay | Admitting: Podiatry

## 2023-11-06 ENCOUNTER — Ambulatory Visit (INDEPENDENT_AMBULATORY_CARE_PROVIDER_SITE_OTHER): Payer: BC Managed Care – PPO | Admitting: Podiatry

## 2023-11-06 DIAGNOSIS — S96911A Strain of unspecified muscle and tendon at ankle and foot level, right foot, initial encounter: Secondary | ICD-10-CM | POA: Diagnosis not present

## 2023-11-06 DIAGNOSIS — M778 Other enthesopathies, not elsewhere classified: Secondary | ICD-10-CM | POA: Diagnosis not present

## 2023-11-06 NOTE — Progress Notes (Unsigned)
     Chief Complaint  Patient presents with   Foot Pain    np right foot sharp pain( pt wears steel toes shoes)   HPI: 54 y.o. female presents today with concern of pain along the outside of the right foot which has been present for approximately 6 months.  States it started when she began wearing steel toe boots for work.  Denies injury.  Denies swelling.  She points to the lateral aspect of the right midfoot as to the area of most pain.  She notes pain is 1-2/10  Past Medical History:  Diagnosis Date   Anemia    Bumps on skin    left lower abdomen   Chicken pox    GERD (gastroesophageal reflux disease)    Headache    Migraines   Irregular heart beat    Sarcoidosis    Past Surgical History:  Procedure Laterality Date   COLONOSCOPY     HYSTERECTOMY ABDOMINAL WITH SALPINGECTOMY Bilateral 05/15/2017   Procedure: HYSTERECTOMY ABDOMINAL WITH SALPINGECTOMY;  Surgeon: Genia Del, MD;  Location: [facility];  Service: Gynecology;  Laterality: Bilateral;  request to follow around 11:30am  requests 1 1/2 hours for the case.   TUBAL LIGATION     No Known Allergies   Physical Exam: There are palpable pedal pulses on the right foot.  No appreciable edema is noted.  No ecchymosis or erythema is noted.  No calor is noted.  There is mild pain on palpation to the dorsal lateral aspect of the fourth/fifth metatarsal-cuboid joint area.  No ankle pain on palpation.  No pain along the peroneal tendons.  Manual muscle testing 5/5.  Epicritic sensation is intact  Radiographic Exam (right foot, 3 weightbearing views, 11/06/2023):  Normal osseous mineralization.  There is medial angulation of the third and fourth toes at the level of the DIPJ.  There is medial angulation of the fifth toe at the MPJ level.  Slight increase in fourth intermetatarsal angle.  No fracture seen.  Assessment/Plan of Care: 1. Strain of right foot, initial encounter   2. Capsulitis of foot, right   3.      Lateral column  pain right foot  Discussed clinical and radiographic findings with patient today.  Informed patient that her foot might not be sitting properly in her steel toe boots.  She was fitted for a pair of power step inserts and a felt lateral wedge was trimmed out and placed underneath her right power step insole.  This should decrease her lateral column discomfort.  The next option would be to change the work boots altogether.  She does not have the work boots with her today for review however.  Follow-up as needed   Clerance Lav, DPM, FACFAS [facility]     [address] [place], [place] [address]                Office [phone]  Fax [phone]

## 2024-06-23 ENCOUNTER — Ambulatory Visit: Admitting: Family Medicine

## 2024-06-23 ENCOUNTER — Encounter: Payer: Self-pay | Admitting: Family Medicine

## 2024-06-23 VITALS — BP 120/88 | HR 88 | Temp 97.9°F | Resp 16 | Ht 61.5 in | Wt 176.2 lb

## 2024-06-23 DIAGNOSIS — Z131 Encounter for screening for diabetes mellitus: Secondary | ICD-10-CM

## 2024-06-23 DIAGNOSIS — Z1211 Encounter for screening for malignant neoplasm of colon: Secondary | ICD-10-CM | POA: Diagnosis not present

## 2024-06-23 DIAGNOSIS — G4700 Insomnia, unspecified: Secondary | ICD-10-CM | POA: Diagnosis not present

## 2024-06-23 DIAGNOSIS — Z0000 Encounter for general adult medical examination without abnormal findings: Secondary | ICD-10-CM | POA: Diagnosis not present

## 2024-06-23 DIAGNOSIS — N951 Menopausal and female climacteric states: Secondary | ICD-10-CM | POA: Diagnosis not present

## 2024-06-23 DIAGNOSIS — K219 Gastro-esophageal reflux disease without esophagitis: Secondary | ICD-10-CM

## 2024-06-23 DIAGNOSIS — E785 Hyperlipidemia, unspecified: Secondary | ICD-10-CM

## 2024-06-23 DIAGNOSIS — I10 Essential (primary) hypertension: Secondary | ICD-10-CM

## 2024-06-23 LAB — COMPREHENSIVE METABOLIC PANEL WITH GFR
ALT: 9 U/L (ref 0–35)
AST: 19 U/L (ref 0–37)
Albumin: 3.9 g/dL (ref 3.5–5.2)
Alkaline Phosphatase: 103 U/L (ref 39–117)
BUN: 13 mg/dL (ref 6–23)
CO2: 31 meq/L (ref 19–32)
Calcium: 9.4 mg/dL (ref 8.4–10.5)
Chloride: 103 meq/L (ref 96–112)
Creatinine, Ser: 0.68 mg/dL (ref 0.40–1.20)
GFR: 98.19 mL/min (ref >60.00)
Glucose, Bld: 97 mg/dL (ref 70–99)
Potassium: 4 meq/L (ref 3.5–5.1)
Sodium: 139 meq/L (ref 135–145)
Total Bilirubin: 0.5 mg/dL (ref 0.2–1.2)
Total Protein: 7.4 g/dL (ref 6.0–8.3)

## 2024-06-23 LAB — LIPID PANEL
Cholesterol: 226 mg/dL — ABNORMAL HIGH (ref 0–200)
HDL: 46.1 mg/dL (ref >39.00)
LDL Cholesterol: 153 mg/dL — ABNORMAL HIGH (ref 0–99)
NonHDL: 179.63
Total CHOL/HDL Ratio: 5
Triglycerides: 131 mg/dL (ref 0.0–149.0)
VLDL: 26.2 mg/dL (ref 0.0–40.0)

## 2024-06-23 LAB — ESTRADIOL: Estradiol: 28 pg/mL

## 2024-06-23 LAB — HEMOGLOBIN A1C: Hgb A1c MFr Bld: 6.4 % (ref 4.6–6.5)

## 2024-06-23 LAB — TSH: TSH: 0.95 u[IU]/mL (ref 0.35–5.50)

## 2024-06-23 NOTE — Assessment & Plan Note (Signed)
 Problem is well-controlled. Continue omeprazole  20 mg before breakfast and GERD precautions.

## 2024-06-23 NOTE — Assessment & Plan Note (Signed)
 We discussed the importance of regular physical activity and healthy diet for prevention of chronic illness and/or complications. Preventive guidelines reviewed. Vaccination: Declined flu vaccine, Prevnar 20, and Shingrix. Status post hysterectomy. She is overdue for colon cancer screening, GI referral placed. Next CPE in a year.

## 2024-06-23 NOTE — Progress Notes (Signed)
 "   Chief Complaint  Patient presents with   Annual Exam    Pt reports she is fasting. Believed she had colonoscopy in [place] 2018?   Discussed the use of AI scribe software for clinical note transcription with the patient, who gave verbal consent to proceed.  History of Present Illness Cheyenne Walters is a 53 year old female with past medical history significant for hypertension, GERD, hyperlipidemia, migraine headaches, and sarcoidosis in remission for 7 years who presents for an annual physical exam. Last CPE on 06/22/2023.  She has been experiencing difficulty staying asleep for the past couple of months, averaging four to five hours of sleep per night. She wakes up early and does not use over-the-counter sleep aids, instead opting to read to help herself fall back asleep. Her husband likes to watch TV while in bed and sometimes turns TV on when he wakes up in the middle of the night.  She experiences hot flashes, which she thinks may be caused by menopause.  She is currently taking an over-the-counter medication, Femrose, for hot flashes. She would like hormones checked.  She exercises regularly, walking briskly for 30 minutes three times a week.  She eats both home-cooked meals and meals out, consumes vegetables daily, drinks wine once or twice a month, and has never smoked.  She has a history of fibroids and underwent a hysterectomy. She has not established care with a new gynecologist since her previous one left the practice. Immunization History  Administered Date(s) Administered   PFIZER(Purple Top)SARS-COV-2 Vaccination 01/01/2020, 01/26/2020   Tdap 05/07/2013, 06/22/2023   Health Maintenance  Topic Date Due   Hepatitis B Vaccines 19-59 Average Risk (1 of 3 - 19+ 3-dose series) Never done   Colonoscopy  Never done   COVID-19 Vaccine (3 - 2025-26 season) 05/26/2024   Cervical Cancer Screening (HPV/Pap Cotest)  09/22/2024 (Originally 04/09/1999)   Zoster Vaccines-  Shingrix (1 of 2) 09/22/2024 (Originally 04/09/2019)   Influenza Vaccine  12/23/2024 (Originally 04/25/2024)   Pneumococcal Vaccine: 50+ Years (1 of 1 - PCV) 06/23/2025 (Originally 04/09/2019)   HIV Screening  06/21/2028 (Originally 04/08/1984)   Mammogram  08/29/2025   DTaP/Tdap/Td (3 - Td or Tdap) 06/21/2033   Hepatitis C Screening  Completed   HPV VACCINES  Aged Out   Meningococcal B Vaccine  Aged Out   HTN: She monitors her blood pressure at home, noting fluctuations with occasional elevations, particularly when she does not get enough rest. Blood pressure is elevated about once every two weeks. Currently she is on no pharmacologic treatment. Lab Results  Component Value Date   NA 139 06/22/2023   CL 103 06/22/2023   K 4.3 06/22/2023   CO2 29 06/22/2023   BUN 12 06/22/2023   CREATININE 0.75 06/22/2023   GFR 90.40 06/22/2023   CALCIUM  9.2 06/22/2023   ALBUMIN 4.0 06/22/2023   GLUCOSE 103 (H) 06/22/2023   Hyperlipidemia: On nonpharmacologic treatment.  Lab Results  Component Value Date   CHOL 228 (H) 06/22/2023   HDL 53.60 06/22/2023   LDLCALC 144 (H) 06/22/2023   TRIG 152.0 (H) 06/22/2023   CHOLHDL 4 06/22/2023   Hemoglobin A1c has been mildly elevated, no history of diabetes. Lab Results  Component Value Date   HGBA1C 5.9 06/22/2023   She is taking omeprazole  20 mg daily before breakfast for acid reflux and also takes a multivitamin containing calcium  and vitamin D.  Review of Systems  Constitutional:  Positive for fatigue. Negative for activity change, appetite  change and fever.  HENT:  Negative for mouth sores, sore throat and trouble swallowing.   Eyes:  Negative for redness and visual disturbance.  Respiratory:  Negative for cough, shortness of breath and wheezing.   Cardiovascular:  Negative for chest pain and leg swelling.  Gastrointestinal:  Negative for abdominal pain, nausea and vomiting.  Endocrine: Negative for cold intolerance, heat intolerance, polydipsia,  polyphagia and polyuria.  Genitourinary:  Negative for decreased urine volume, dysuria, hematuria, vaginal bleeding and vaginal discharge.  Musculoskeletal:  Negative for gait problem and myalgias.  Skin:  Negative for color change and rash.  Allergic/Immunologic: Negative for environmental allergies.  Neurological:  Negative for syncope, weakness and headaches.  Hematological:  Negative for adenopathy. Does not bruise/bleed easily.  Psychiatric/Behavioral:  Positive for sleep disturbance. Negative for confusion. The patient is not nervous/anxious.   All other systems reviewed and are negative.  Current Outpatient Medications on File Prior to Visit  Medication Sig Dispense Refill   Multiple Vitamins-Minerals (MULTIPLE VITAMINS/WOMENS PO) Take 2 tablets by mouth daily. Gummy Vitamins     omeprazole  (PRILOSEC) 20 MG capsule Take 1 capsule (20 mg total) by mouth daily before breakfast.     No current facility-administered medications on file prior to visit.   Past Medical History:  Diagnosis Date   Anemia    Bumps on skin    left lower abdomen   Chicken pox    GERD (gastroesophageal reflux disease)    Headache    Migraines   Irregular heart beat    Sarcoidosis    Past Surgical History:  Procedure Laterality Date   COLONOSCOPY     HYSTERECTOMY ABDOMINAL WITH SALPINGECTOMY Bilateral 05/15/2017   Procedure: HYSTERECTOMY ABDOMINAL WITH SALPINGECTOMY;  Surgeon: Georgia Blonder, MD;  Location: [facility];  Service: Gynecology;  Laterality: Bilateral;  request to follow around 11:30am  requests 1 1/2 hours for the case.   TUBAL LIGATION     No Known Allergies  Family History  Problem Relation Age of Onset   Alcohol abuse Father    Hypertension Maternal Grandmother    Diabetes Maternal Grandmother    Hypertension Maternal Grandfather    Diabetes Sister    Cancer Neg Hx        colon or gyn   Social History   Socioeconomic History   Marital status: Married    Spouse name: Not  on file   Number of children: Not on file   Years of education: Not on file   Highest education level: Not on file  Occupational History   Not on file  Tobacco Use   Smoking status: Never   Smokeless tobacco: Never  Vaping Use   Vaping status: Never Used  Substance and Sexual Activity   Alcohol use: Yes    Comment: OCC   Drug use: No   Sexual activity: Yes    Partners: Male  Other Topics Concern   Not on file  Social History Narrative   Not on file   Social Drivers of Health   Financial Resource Strain: Not on file  Food Insecurity: Not on file  Transportation Needs: Not on file  Physical Activity: Insufficiently Active (06/23/2024)   Exercise Vital Sign    Days of Exercise per Week: 3 days    Minutes of Exercise per Session: 30 min  Stress: No Stress Concern Present (06/23/2024)   [employer] of Occupational [employer] - Occupational Stress Questionnaire    Feeling of Stress: Only a little  Social Connections: Not  on file   Vitals:   06/23/24 1040  BP: 120/88  Pulse: 88  Resp: 16  Temp: 97.9 F (36.6 C)  SpO2: 97%   Body mass index is 32.75 kg/m.  Wt Readings from Last 3 Encounters:  06/23/24 176 lb 3.2 oz (79.9 kg)  06/22/23 181 lb 4 oz (82.2 kg)  01/10/23 174 lb (78.9 kg)   Physical Exam Vitals and nursing note reviewed.  Constitutional:      General: She is not in acute distress.    Appearance: She is well-developed.  HENT:     Head: Normocephalic and atraumatic.     Right Ear: Tympanic membrane, ear canal and external ear normal.     Left Ear: Tympanic membrane, ear canal and external ear normal.     Mouth/Throat:     Mouth: Mucous membranes are moist.     Tongue: No lesions.     Pharynx: Oropharynx is clear.  Eyes:     Extraocular Movements: Extraocular movements intact.     Conjunctiva/sclera: Conjunctivae normal.     Pupils: Pupils are equal, round, and reactive to light.  Neck:     Thyroid : No thyroid  mass or thyromegaly.   Cardiovascular:     Rate and Rhythm: Normal rate and regular rhythm.     Pulses:          Dorsalis pedis pulses are 2+ on the right side and 2+ on the left side.     Heart sounds: No murmur heard. Pulmonary:     Effort: Pulmonary effort is normal. No respiratory distress.     Breath sounds: Normal breath sounds.  Abdominal:     Palpations: Abdomen is soft. There is no hepatomegaly or mass.     Tenderness: There is no abdominal tenderness.  Genitourinary:    Comments: No concerns today. Musculoskeletal:     Comments: No major deformity or signs of synovitis appreciated.  Lymphadenopathy:     Cervical: No cervical adenopathy.     Upper Body:     Right upper body: No supraclavicular adenopathy.     Left upper body: No supraclavicular adenopathy.  Skin:    General: Skin is warm.     Findings: No erythema or rash.  Neurological:     General: No focal deficit present.     Mental Status: She is alert and oriented to person, place, and time.     Cranial Nerves: No cranial nerve deficit.     Coordination: Coordination normal.     Gait: Gait normal.     Deep Tendon Reflexes:     Reflex Scores:      Bicep reflexes are 2+ on the right side and 2+ on the left side.      Patellar reflexes are 2+ on the right side and 2+ on the left side. Psychiatric:        Mood and Affect: Mood and affect normal.   ASSESSMENT AND PLAN: Cheyenne Walters was here today annual physical examination.  Orders Placed This Encounter  Procedures   TSH   Estradiol    Follicle stimulating hormone   Comprehensive metabolic panel with GFR   Hemoglobin A1c   Lipid panel   Ambulatory referral to Gastroenterology   Lab Results  Component Value Date   TSH 0.95 06/23/2024   Lab Results  Component Value Date   NA 139 06/23/2024   CL 103 06/23/2024   K 4.0 06/23/2024   CO2 31 06/23/2024   BUN 13 06/23/2024  CREATININE 0.68 06/23/2024   GFR 98.19 06/23/2024   CALCIUM  9.4 06/23/2024   ALBUMIN 3.9  06/23/2024   GLUCOSE 97 06/23/2024   Lab Results  Component Value Date   ALT 9 06/23/2024   AST 19 06/23/2024   ALKPHOS 103 06/23/2024   BILITOT 0.5 06/23/2024   Lab Results  Component Value Date   HGBA1C 6.4 06/23/2024   Lab Results  Component Value Date   CHOL 226 (H) 06/23/2024   HDL 46.10 06/23/2024   LDLCALC 153 (H) 06/23/2024   TRIG 131.0 06/23/2024   CHOLHDL 5 06/23/2024   The 10-year ASCVD risk score (Arnett DK, et al., 2019) is: 3.5%   Values used to calculate the score:     Age: 50 years     Clincally relevant sex: Female     Is Non-Hispanic African American: Yes     Diabetic: No     Tobacco smoker: No     Systolic Blood Pressure: 120 mmHg     Is BP treated: No     HDL Cholesterol: 46.1 mg/dL     Total Cholesterol: 226 mg/dL  Routine general medical examination at a health care facility Assessment & Plan: We discussed the importance of regular physical activity and healthy diet for prevention of chronic illness and/or complications. Preventive guidelines reviewed. Vaccination: Declined flu vaccine, Prevnar 20, and Shingrix. Status post hysterectomy. She is overdue for colon cancer screening, GI referral placed. Next CPE in a year.   Diabetes mellitus screening -     Hemoglobin A1c; Future  Hyperlipidemia, unspecified hyperlipidemia type Assessment & Plan: Last year she was not interested in pharmacologic treatment. Further recommendation will be given according to lab result, she agrees with starting atorvastatin  if cholesterol is still elevated.  Orders: -     Comprehensive metabolic panel with GFR; Future -     Lipid panel; Future  Colon cancer screening -     Ambulatory referral to Gastroenterology  Insomnia, unspecified type We discussed possible etiologies, definitely menopausal can be a contributing factor. Stressed the importance of good sleep hygiene. OTC medication like melatonin or Unisom are good options.  -     TSH;  Future  Menopausal vasomotor syndrome Status post hysterectomy. We discussed nonhormonal treatment options.  For now she is not interested in pharmacologic treatment.  -     Estradiol  -     Follicle stimulating hormone; Future  HYPERTENSION Assessment & Plan: In general BP seems to be adequately controlled, occasionally elevated due to lack of sleep. Continue nonpharmacologic treatment. Continue monitoring monitoring BP regularly.  Gastroesophageal reflux disease, unspecified whether esophagitis present Assessment & Plan: Problem is well-controlled. Continue omeprazole  20 mg before breakfast and GERD precautions.  Return in 1 year (on 06/23/2025) for CPE.  Drenda Sobecki G. Marsi Turvey, MD  [facility]. [place] [facility]. "

## 2024-06-23 NOTE — Assessment & Plan Note (Signed)
 In general BP seems to be adequately controlled, occasionally elevated due to lack of sleep. Continue nonpharmacologic treatment. Continue monitoring monitoring BP regularly.

## 2024-06-23 NOTE — Assessment & Plan Note (Signed)
 Last year she was not interested in pharmacologic treatment. Further recommendation will be given according to lab result, she agrees with starting atorvastatin if cholesterol is still elevated.

## 2024-06-23 NOTE — Patient Instructions (Addendum)
 A few things to remember from today's visit:  Routine general medical examination at a health care facility  Diabetes mellitus screening - Plan: Hemoglobin A1c  Hyperlipidemia, unspecified hyperlipidemia type - Plan: Comprehensive metabolic panel with GFR, Lipid panel  Colon cancer screening - Plan: Ambulatory referral to Gastroenterology  Insomnia, unspecified type - Plan: TSH  Menopausal vasomotor syndrome - Plan: Estradiol, Follicle stimulating hormone  If you need refills for medications you take chronically, please call your pharmacy. Do not use My Chart to request refills or for acute issues that need immediate attention. If you send a my chart message, it may take a few days to be addressed, specially if I am not in the office.  Please be sure medication list is accurate. If a new problem present, please set up appointment sooner than planned today.  Health Maintenance, Female Adopting a healthy lifestyle and getting preventive care are important in promoting health and wellness. Ask your health care provider about: The right schedule for you to have regular tests and exams. Things you can do on your own to prevent diseases and keep yourself healthy. What should I know about diet, weight, and exercise? Eat a healthy diet  Eat a diet that includes plenty of vegetables, fruits, low-fat dairy products, and lean protein. Do not eat a lot of foods that are high in solid fats, added sugars, or sodium. Maintain a healthy weight Body mass index (BMI) is used to identify weight problems. It estimates body fat based on height and weight. Your health care provider can help determine your BMI and help you achieve or maintain a healthy weight. Get regular exercise Get regular exercise. This is one of the most important things you can do for your health. Most adults should: Exercise for at least 150 minutes each week. The exercise should increase your heart rate and make you sweat  (moderate-intensity exercise). Do strengthening exercises at least twice a week. This is in addition to the moderate-intensity exercise. Spend less time sitting. Even light physical activity can be beneficial. Watch cholesterol and blood lipids Have your blood tested for lipids and cholesterol at 55 years of age, then have this test every 5 years. Have your cholesterol levels checked more often if: Your lipid or cholesterol levels are high. You are older than 55 years of age. You are at high risk for heart disease. What should I know about cancer screening? Depending on your health history and family history, you may need to have cancer screening at various ages. This may include screening for: Breast cancer. Cervical cancer. Colorectal cancer. Skin cancer. Lung cancer. What should I know about heart disease, diabetes, and high blood pressure? Blood pressure and heart disease High blood pressure causes heart disease and increases the risk of stroke. This is more likely to develop in people who have high blood pressure readings or are overweight. Have your blood pressure checked: Every 3-5 years if you are 83-65 years of age. Every year if you are 25 years old or older. Diabetes Have regular diabetes screenings. This checks your fasting blood sugar level. Have the screening done: Once every three years after age 40 if you are at a normal weight and have a low risk for diabetes. More often and at a younger age if you are overweight or have a high risk for diabetes. What should I know about preventing infection? Hepatitis B If you have a higher risk for hepatitis B, you should be screened for this virus. Talk with  your health care provider to find out if you are at risk for hepatitis B infection. Hepatitis C Testing is recommended for: Everyone born from [birth year] through 1967. Anyone with known risk factors for hepatitis C. Sexually transmitted infections (STIs) Get screened for STIs,  including gonorrhea and chlamydia, if: You are sexually active and are younger than 55 years of age. You are older than 55 years of age and your health care provider tells you that you are at risk for this type of infection. Your sexual activity has changed since you were last screened, and you are at increased risk for chlamydia or gonorrhea. Ask your health care provider if you are at risk. Ask your health care provider about whether you are at high risk for HIV. Your health care provider may recommend a prescription medicine to help prevent HIV infection. If you choose to take medicine to prevent HIV, you should first get tested for HIV. You should then be tested every 3 months for as long as you are taking the medicine. Pregnancy If you are about to stop having your period (premenopausal) and you may become pregnant, seek counseling before you get pregnant. Take 400 to 800 micrograms (mcg) of folic acid every day if you become pregnant. Ask for birth control (contraception) if you want to prevent pregnancy. Osteoporosis and menopause Osteoporosis is a disease in which the bones lose minerals and strength with aging. This can result in bone fractures. If you are 39 years old or older, or if you are at risk for osteoporosis and fractures, ask your health care provider if you should: Be screened for bone loss. Take a calcium or vitamin D supplement to lower your risk of fractures. Be given hormone replacement therapy (HRT) to treat symptoms of menopause. Follow these instructions at home: Alcohol use Do not drink alcohol if: Your health care provider tells you not to drink. You are pregnant, may be pregnant, or are planning to become pregnant. If you drink alcohol: Limit how much you have to: 0-1 drink a day. Know how much alcohol is in your drink. In the [place] one drink equals one 12 oz bottle of beer (355 mL), one 5 oz glass of wine (148 mL), or one 1 oz glass of hard liquor (44  mL). Lifestyle Do not use any products that contain nicotine or tobacco. These products include cigarettes, chewing tobacco, and vaping devices, such as e-cigarettes. If you need help quitting, ask your health care provider. Do not use street drugs. Do not share needles. Ask your health care provider for help if you need support or information about quitting drugs. General instructions Schedule regular health, dental, and eye exams. Stay current with your vaccines. Tell your health care provider if: You often feel depressed. You have ever been abused or do not feel safe at home. Summary Adopting a healthy lifestyle and getting preventive care are important in promoting health and wellness. Follow your health care provider's instructions about healthy diet, exercising, and getting tested or screened for diseases. Follow your health care provider's instructions on monitoring your cholesterol and blood pressure. This information is not intended to replace advice given to you by your health care provider. Make sure you discuss any questions you have with your health care provider. Document Revised: 01/31/2021 Document Reviewed: 01/31/2021 Elsevier Patient Education  2024 [employer].

## 2024-06-24 ENCOUNTER — Ambulatory Visit: Payer: Self-pay | Admitting: Family Medicine

## 2024-06-24 DIAGNOSIS — E785 Hyperlipidemia, unspecified: Secondary | ICD-10-CM

## 2024-06-24 LAB — FOLLICLE STIMULATING HORMONE: FSH: 82.3 m[IU]/mL

## 2024-06-24 MED ORDER — ATORVASTATIN CALCIUM 10 MG PO TABS: 10.0000 mg | ORAL_TABLET | Freq: Every day | ORAL | 3 refills | Status: AC

## 2024-09-01 ENCOUNTER — Other Ambulatory Visit: Payer: Self-pay | Admitting: Family Medicine

## 2024-09-01 DIAGNOSIS — Z1231 Encounter for screening mammogram for malignant neoplasm of breast: Secondary | ICD-10-CM

## 2024-09-27 ENCOUNTER — Encounter: Payer: Self-pay | Admitting: Internal Medicine

## 2024-10-01 ENCOUNTER — Ambulatory Visit
Admission: RE | Admit: 2024-10-01 | Discharge: 2024-10-01 | Disposition: A | Discharge status: Home / Self Care | Source: Ambulatory Visit

## 2024-10-01 DIAGNOSIS — Z1231 Encounter for screening mammogram for malignant neoplasm of breast: Secondary | ICD-10-CM
